# Patient Record
Sex: Female | Born: 1990 | Race: White | Hispanic: No | Marital: Single | Smoking: Never smoker
Health system: Southern US, Community
[De-identification: ages and names within clinical notes are randomized; demographics above are authoritative.]

## PROBLEM LIST (undated history)

## (undated) DIAGNOSIS — K589 Irritable bowel syndrome without diarrhea: Secondary | ICD-10-CM

## (undated) DIAGNOSIS — E559 Vitamin D deficiency, unspecified: Secondary | ICD-10-CM

## (undated) DIAGNOSIS — F329 Major depressive disorder, single episode, unspecified: Secondary | ICD-10-CM

## (undated) DIAGNOSIS — R002 Palpitations: Secondary | ICD-10-CM

## (undated) DIAGNOSIS — E739 Lactose intolerance, unspecified: Secondary | ICD-10-CM

## (undated) DIAGNOSIS — G4730 Sleep apnea, unspecified: Secondary | ICD-10-CM

## (undated) DIAGNOSIS — E162 Hypoglycemia, unspecified: Secondary | ICD-10-CM

## (undated) DIAGNOSIS — F32A Depression, unspecified: Secondary | ICD-10-CM

## (undated) DIAGNOSIS — F988 Other specified behavioral and emotional disorders with onset usually occurring in childhood and adolescence: Secondary | ICD-10-CM

## (undated) DIAGNOSIS — R51 Headache: Secondary | ICD-10-CM

## (undated) DIAGNOSIS — E669 Obesity, unspecified: Secondary | ICD-10-CM

## (undated) DIAGNOSIS — R0602 Shortness of breath: Secondary | ICD-10-CM

## (undated) DIAGNOSIS — F419 Anxiety disorder, unspecified: Secondary | ICD-10-CM

## (undated) HISTORY — DX: Major depressive disorder, single episode, unspecified: F32.9

## (undated) HISTORY — DX: Anxiety disorder, unspecified: F41.9

## (undated) HISTORY — DX: Vitamin D deficiency, unspecified: E55.9

## (undated) HISTORY — DX: Palpitations: R00.2

## (undated) HISTORY — DX: Headache: R51

## (undated) HISTORY — PX: ESOPHAGOGASTRODUODENOSCOPY ENDOSCOPY: SHX5814

## (undated) HISTORY — DX: Irritable bowel syndrome, unspecified: K58.9

## (undated) HISTORY — DX: Sleep apnea, unspecified: G47.30

## (undated) HISTORY — PX: CERVICAL BIOPSY  W/ LOOP ELECTRODE EXCISION: SUR135

## (undated) HISTORY — DX: Lactose intolerance, unspecified: E73.9

## (undated) HISTORY — DX: Obesity, unspecified: E66.9

## (undated) HISTORY — DX: Other specified behavioral and emotional disorders with onset usually occurring in childhood and adolescence: F98.8

## (undated) HISTORY — DX: Shortness of breath: R06.02

## (undated) HISTORY — DX: Hypoglycemia, unspecified: E16.2

## (undated) HISTORY — DX: Depression, unspecified: F32.A

---
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
## 2009-01-13 ENCOUNTER — Ambulatory Visit: Payer: Self-pay | Admitting: Family Medicine

## 2009-01-13 DIAGNOSIS — L719 Rosacea, unspecified: Secondary | ICD-10-CM

## 2009-01-14 ENCOUNTER — Encounter: Payer: Self-pay | Admitting: Family Medicine

## 2009-01-20 ENCOUNTER — Telehealth: Payer: Self-pay | Admitting: Family Medicine

## 2009-01-26 ENCOUNTER — Encounter: Payer: Self-pay | Admitting: Family Medicine

## 2009-02-10 ENCOUNTER — Ambulatory Visit: Payer: Self-pay | Admitting: Family Medicine

## 2009-02-10 DIAGNOSIS — R7309 Other abnormal glucose: Secondary | ICD-10-CM | POA: Insufficient documentation

## 2009-04-12 ENCOUNTER — Encounter: Payer: Self-pay | Admitting: Family Medicine

## 2009-08-16 ENCOUNTER — Ambulatory Visit: Payer: Self-pay | Admitting: Family Medicine

## 2009-08-16 DIAGNOSIS — F341 Dysthymic disorder: Secondary | ICD-10-CM | POA: Insufficient documentation

## 2009-08-28 ENCOUNTER — Ambulatory Visit: Payer: Self-pay | Admitting: Licensed Clinical Social Worker

## 2009-09-08 ENCOUNTER — Ambulatory Visit: Payer: Self-pay | Admitting: Licensed Clinical Social Worker

## 2009-10-02 ENCOUNTER — Ambulatory Visit: Payer: Self-pay | Admitting: Licensed Clinical Social Worker

## 2009-10-03 ENCOUNTER — Ambulatory Visit: Payer: Self-pay | Admitting: Family Medicine

## 2009-10-03 ENCOUNTER — Encounter: Payer: Self-pay | Admitting: Family Medicine

## 2009-10-04 ENCOUNTER — Ambulatory Visit: Payer: Self-pay | Admitting: Family Medicine

## 2009-10-16 ENCOUNTER — Ambulatory Visit: Payer: Self-pay | Admitting: Licensed Clinical Social Worker

## 2009-10-22 ENCOUNTER — Ambulatory Visit: Payer: Self-pay | Admitting: Family Medicine

## 2009-11-13 ENCOUNTER — Ambulatory Visit: Payer: Self-pay | Admitting: Licensed Clinical Social Worker

## 2009-11-22 ENCOUNTER — Ambulatory Visit: Payer: Self-pay | Admitting: Family Medicine

## 2010-01-19 ENCOUNTER — Encounter: Payer: Self-pay | Admitting: Family Medicine

## 2010-03-09 ENCOUNTER — Ambulatory Visit: Payer: Self-pay | Admitting: Family Medicine

## 2010-03-21 ENCOUNTER — Ambulatory Visit: Payer: Self-pay | Admitting: Psychology

## 2010-04-03 ENCOUNTER — Ambulatory Visit: Payer: Self-pay | Admitting: Psychology

## 2010-04-16 ENCOUNTER — Ambulatory Visit: Payer: Self-pay | Admitting: Family Medicine

## 2010-07-17 ENCOUNTER — Encounter: Payer: Self-pay | Admitting: Family Medicine

## 2010-07-17 ENCOUNTER — Ambulatory Visit
Admission: RE | Admit: 2010-07-17 | Discharge: 2010-07-17 | Payer: Self-pay | Source: Home / Self Care | Attending: Family Medicine | Admitting: Family Medicine

## 2010-07-24 NOTE — Consult Note (Signed)
Summary: [facility]  [facility]   Imported By: Lanelle Bal 10/17/2009 09:09:33  _____________________________________________________________________  External Attachment:    Type:   Image     Comment:   External Document

## 2010-07-24 NOTE — Assessment & Plan Note (Signed)
Summary: 4-6 WEEK FOLLOW UP/[facility]H   Vital Signs:  Patient profile:   20 year old female Height:      60.25 inches Weight:      233.25 pounds BMI:     45.34 Temp:     98.1 degrees F oral Pulse rate:   84 / minute Pulse rhythm:   regular BP sitting:   104 / 70  (left arm) Cuff size:   large  Vitals Entered By: Lewanda Rife LPN (April 16, 2010 3:40 PM) CC: follow-up visit   History of Present Illness: here for f/u of depression and eating issues   last visit started her on wellbutrin  this seems to have evened her out in emotions (less ups and downs) still tired  more motivation - but not the energy to back it up and get things done  no side effects  work is going well  has been very hot lately     saw nutritionist prior and was working on some food changes  disc easing into the vegetables  has not made another appt with her  she is really interested in hypnotist  she has barrier - that she will not like it - part of it is texture   is still trying hard with fruit  has not been packing her lunch lately- getting lazy    saw Dr Laymond Purser for counseling she likes her   wt is up 4 lb today    Allergies: 1)  ! * Topamax 2)  ! * Relpax  Past History:  Past Medical History: Last updated: 08/16/2009 obesity (4/04) Headaches (5/06) anxiety picky eater - for the most part no fruit or veg hypogycemia (3 h GTT) depression/ anxiety  neuro- Hickling  counselor- susan Bond   Family History: Last updated: 01/13/2009 MGF- Heart disease PGF- Prostate CA MGM-DM  Social History: Last updated: 10/04/2009 works at [employer]  very picky eating habits - dislikes fruit and veg   Review of Systems General:  Denies fatigue, loss of appetite, and malaise. Eyes:  Denies blurring and eye irritation. CV:  Denies chest pain or discomfort and palpitations. Resp:  Denies cough. GI:  Denies abdominal pain and change in bowel habits. Derm:  Denies itching, lesion(s), poor wound  healing, and rash. Neuro:  Denies numbness and tingling. Psych:  Complains of depression; denies easily tearful, irritability, panic attacks, sense of great danger, and suicidal thoughts/plans; mood is overall improved . Endo:  Denies cold intolerance, excessive thirst, excessive urination, and heat intolerance. Heme:  Denies abnormal bruising and bleeding.  Physical Exam  General:  obese and well appearing  Head:  normocephalic, atraumatic, and no abnormalities observed.   Eyes:  vision grossly intact, pupils equal, pupils round, and pupils reactive to light.  no conjunctival pallor, injection or icterus  Neck:  supple with full rom and no masses or thyromegally, no JVD or carotid bruit  Lungs:  Normal respiratory effort, chest expands symmetrically. Lungs are clear to auscultation, no crackles or wheezes. Heart:  Normal rate and regular rhythm. S1 and S2 normal without gallop, murmur, click, rub or other extra sounds. Neurologic:  sensation intact to light touch, gait normal, and DTRs symmetrical and normal.  no tremor  Skin:  Intact without suspicious lesions or rashes pink cheeks from rosacea Cervical Nodes:  No lymphadenopathy noted Psych:  cheerful and talkative today  still voices phobia of vegetables and healthy eating    Impression & Recommendations:  Problem # 1:  DEPRESSION/ANXIETY (ICD-300.4) Assessment  Improved  wellbutrin is starting to help -- will inc dose to 300 investigate options for hypnotism for fear of vegetables  rev rec for healthy diet and exercise  Orders: Prescription Created Electronically [phone])  Complete Medication List: 1)  Metronidazole 0.75 % Lotn (Metronidazole) .... Apply small amount to face once daily after washing and drying it as needed. 2)  Excedrin Pm 500-38 Mg Tabs (Diphenhydramine-apap (sleep)) .... Otc as directed. 3)  Motrin Ib 200 Mg Tabs (Ibuprofen) .... Otc as directed. 4)  Wellbutrin Xl 300 Mg Xr24h-tab (Bupropion hcl) .Marland Kitchen.. 1 by  mouth once daily  Patient Instructions: 1)  increase the wellbutrin xl 150 to 300 once daily  2)  new px will equal 300  3)  update me if any side effects  4)  I will do some research about hypnotism for diet issues  5)  follow up with me in 3 month  Prescriptions: WELLBUTRIN XL 300 MG XR24H-TAB (BUPROPION HCL) 1 by mouth once daily  #30 x 11   Entered and Authorized by:   Judith Part MD   Signed by:   Judith Part MD on 04/16/2010   Method used:   Electronically to        [facility] (retail)       [address], [address]       [place], [place]  [address]       Ph: [phone]       Fax: [phone]   RxID:   [id number]    Orders Added: 1)  Prescription Created Electronically [G8553] 2)  Est. Patient Level III [27253]    Current Allergies (reviewed today): ! * TOPAMAX ! * RELPAX

## 2010-07-24 NOTE — Assessment & Plan Note (Signed)
Summary: FOLLOWUP/RBH   Vital Signs:  Patient profile:   20 year old female Height:      60.25 inches Weight:      229.75 pounds BMI:     44.66 Temp:     98.2 degrees F oral Pulse rate:   76 / minute Pulse rhythm:   regular BP sitting:   100 / 68  (left arm) Cuff size:   large  Vitals Entered By: Lewanda Rife LPN (March 09, 2010 11:35 AM) CC: follow up from last visit   History of Present Illness: here for f/u of depression and also obesity and hypoglycemia   is doing ok overall   last visit depression was better with counseling Judithe Modest)  is done seeing her - she did not like her a lot  has the name of another counselor   wt is up 3 lb bmi 44  went to nutritionist who made some suggestions  no regular soda -- stopped it totally - no mt dew ,, but still drinks sweet tea - only when she eats out 2 times per week  tried to eat 2 servings of fruit a day-- then lost motivation  using 100 cal snack packs  portion control is better  not walking due to motivation problem  has no motivation to do anything  ? if depression  on emotional roller coaster - and gets frustrated over small things    bp good   work is going well -- on vaction   Allergies: 1)  ! * Topamax 2)  ! * Relpax  Past History:  Past Medical History: Last updated: 08/16/2009 obesity (4/04) Headaches (5/06) anxiety picky eater - for the most part no fruit or veg hypogycemia (3 h GTT) depression/ anxiety  neuro- Hickling  counselor- susan Bond   Family History: Last updated: 01/13/2009 MGF- Heart disease PGF- Prostate CA MGM-DM  Social History: Last updated: 10/04/2009 works at [employer]  very picky eating habits - dislikes fruit and veg   Review of Systems General:  Complains of fatigue; denies loss of appetite, malaise, and sleep disorder. Eyes:  Denies blurring and eye irritation. CV:  Denies chest pain or discomfort, lightheadness, and palpitations. Resp:  Denies cough,  shortness of breath, and wheezing. GI:  Denies abdominal pain, change in bowel habits, indigestion, and nausea. GU:  Denies dysuria. MS:  Denies cramps. Derm:  Denies itching, lesion(s), poor wound healing, and rash. Neuro:  Denies numbness and tingling. Psych:  Complains of anxiety and depression; denies panic attacks, sense of great danger, and suicidal thoughts/plans. Endo:  Denies excessive thirst and excessive urination. Heme:  Denies abnormal bruising and bleeding.  Physical Exam  General:  obese and well appearing  Head:  normocephalic, atraumatic, and no abnormalities observed.   Eyes:  vision grossly intact, pupils equal, pupils round, and pupils reactive to light.  no conjunctival pallor, injection or icterus  Mouth:  pharynx pink and moist.   Neck:  supple with full rom and no masses or thyromegally, no JVD or carotid bruit  Lungs:  Normal respiratory effort, chest expands symmetrically. Lungs are clear to auscultation, no crackles or wheezes. Heart:  Normal rate and regular rhythm. S1 and S2 normal without gallop, murmur, click, rub or other extra sounds. Abdomen:  soft and non-tender.   Msk:  No deformity or scoliosis noted of thoracic or lumbar spine.  no acute joint changes  Extremities:  No clubbing, cyanosis, edema, or deformity noted with normal full range of motion  of all joints.   Neurologic:  sensation intact to light touch, gait normal, and DTRs symmetrical and normal.  no tremor  Skin:  Intact without suspicious lesions or rashes Cervical Nodes:  No lymphadenopathy noted Psych:  talkative but seems down at times very candid about her symptoms good eye contact    Impression & Recommendations:  Problem # 1:  DEPRESSION/ANXIETY (ICD-300.4) Assessment Deteriorated this is worse with fatigue and dec motivation also some food fears/ fear of trying new things ref to new counselor spent 25 minutes face to face time with pt , over 50% of which was spent on  counseling and coordination of care   disc stressors/ symptoms/ coping mech/ support sources and opt for tx trial of wellbutrin xl  f/u 6 wk  Orders: Psychology Referral (Psychology)  Problem # 2:  HYPOGLYCEMIA (ICD-251.2) Assessment: Improved diet is better but need to break through food intol barriers will work with counselor  Problem # 3:  OBESITY (ICD-278.00) Assessment: Unchanged rev rec from nutritionist is apprehensive to try fruit and veg  counseling initiated  needs motivation to exercise  Complete Medication List: 1)  Metronidazole 0.75 % Lotn (Metronidazole) .... Apply small amount to face once daily after washing and drying it as needed. 2)  Excedrin Pm 500-38 Mg Tabs (Diphenhydramine-apap (sleep)) .... Otc as directed. 3)  Motrin Ib 200 Mg Tabs (Ibuprofen) .... Otc as directed. 4)  Wellbutrin Xl 150 Mg Xr24h-tab (Bupropion hcl) .Marland Kitchen.. 1 by mouth once daily in am  Patient Instructions: 1)  try the wellbutrin for depression  2)  if side effects or if you feel more depressed or suicidal -- please stop it and let me know  3)  we will refer you to counselor at check out  4)  try to keep working on diet and exercise  5)  follow up with me in 4-6 weeks  Prescriptions: WELLBUTRIN XL 150 MG XR24H-TAB (BUPROPION HCL) 1 by mouth once daily in am  #30 x 11   Entered and Authorized by:   Judith Part MD   Signed by:   Judith Part MD on 03/09/2010   Method used:   Electronically to        [facility] (retail)       [address], [address]       [place], [place]  [address]       Ph: [phone]       Fax: [phone]   RxID:   [id number]   Current Allergies (reviewed today): ! * TOPAMAX ! * RELPAX

## 2010-07-24 NOTE — Letter (Signed)
Summary: [facility]  [facility]   Imported By: Lanelle Bal 02/05/2010 09:45:11  _____________________________________________________________________  External Attachment:    Type:   Image     Comment:   External Document

## 2010-07-24 NOTE — Assessment & Plan Note (Signed)
Summary: DISCUSS ENERGY LEVEL,LOW SUGAR/CLE   Vital Signs:  Patient profile:   20 year old female Height:      60.25 inches Weight:      221 pounds BMI:     42.96 Temp:     98.2 degrees F oral Pulse rate:   76 / minute Pulse rhythm:   regular BP sitting:   114 / 70  (left arm) Cuff size:   large  Vitals Entered By: Lewanda Rife LPN (August 16, 2009 3:01 PM)  History of Present Illness: is having trouble with low energy level  is really tired - esp at the end of the day  wants to go home and go to bed  works 7-5 -- does not think she is that tired , works at [employer]- likes it ok , happy to have a job  is dismotivated - often does not want to go anywhere  no regular exercise went to gym until oct 0 then stopped  ? if depression and overload   stress sister got married 1 mo and then split  then she moved back home with 3 dogs  this is hard on her  does better apart - does not live well together   also lives with mom and dad    not making enough $ to move out yet  is saving - but had to put new trasmission in her car   also some problems controling emotions  gets the urge to cry and is tearful for more reason hard to focus with some obcessive thoughts -- for example about dating someone  some obtrusive neg thoughts -- no suicidal ideation or plans to hurt herself  some anxiety problems as well - this is ever since HS and seems to be coming back  gets anxous- then hot - then worried and occ almost a panic attack     gf passed away in 04-23-23 after demetia- that was also troubling   talked to counselor in HS - did not like her  was on med in hs -  ? what it was for , and it did not help - and kept her up  was ref to nutritionist in fall - could not go with schedule -- and needs to return  is not eating for hypoglycemia like she should  waits to late to eat in am , and some sweets  also gaining wt  3 meals per day  still gets some hypoglycemic epidoses - esp if  nervous -- and that is not as common as it used to be   is trying some fruit but no vegetables     Allergies: 1)  ! * Topamax 2)  ! * Relpax  Past History:  Past Medical History: obesity (4/04) Headaches (5/06) anxiety picky eater - for the most part no fruit or veg hypogycemia (3 h GTT) depression/ anxiety  neuro- Hickling  counselor- susan Bond   Review of Systems General:  Complains of fatigue; denies fever, loss of appetite, and malaise. Eyes:  Denies blurring and eye irritation. CV:  Denies chest pain or discomfort, lightheadness, palpitations, and shortness of breath with exertion. Resp:  Denies cough and wheezing. GI:  Denies abdominal pain, bloody stools, and change in bowel habits. GU:  Denies dysuria. MS:  Denies joint pain, joint redness, and joint swelling. Derm:  Denies lesion(s), poor wound healing, and rash. Neuro:  Denies numbness and tingling. Psych:  Complains of anxiety and depression; denies panic attacks, sense of  great danger, and suicidal thoughts/plans. Endo:  Denies excessive thirst and excessive urination. Heme:  Denies abnormal bruising and bleeding.  Physical Exam  General:  overweight but generally well appearing  Head:  normocephalic, atraumatic, and no abnormalities observed.   Eyes:  vision grossly intact, pupils equal, pupils round, and pupils reactive to light.  no conjunctival pallor, injection or icterus  Mouth:  pharynx pink and moist.   Neck:  supple with full rom and no masses or thyromegally, no JVD or carotid bruit  Chest Wall:  No deformities, masses, or tenderness noted. Lungs:  Normal respiratory effort, chest expands symmetrically. Lungs are clear to auscultation, no crackles or wheezes. Heart:  Normal rate and regular rhythm. S1 and S2 normal without gallop, murmur, click, rub or other extra sounds. Abdomen:  soft and non-tender.   Msk:  No deformity or scoliosis noted of thoracic or lumbar spine.  no acute joint changes    Extremities:  No clubbing, cyanosis, edema, or deformity noted with normal full range of motion of all joints.   Neurologic:  sensation intact to light touch, gait normal, and DTRs symmetrical and normal.   Skin:  Intact without suspicious lesions or rashes fair complexion Cervical Nodes:  No lymphadenopathy noted Psych:  somewhat depressed but not tearful- more fatigued appearing good eye contact and communication skills    Impression & Recommendations:  Problem # 1:  DEPRESSION/ANXIETY (ICD-300.4) Assessment New with fatigue/ dismotivation and tearfulness/ also anxious tendancies ref to counseling may need to consider ssri in future - depending disc imp of exercise overall good insight disc symptoms/ tx options/ coping skills/ support sources in detail today 30 min face to face time 50% spent on counseling and coordination of care  Orders: Psychology Referral (Psychology)  Problem # 2:  HYPOGLYCEMIA (ICD-251.2) Assessment: Unchanged  poor diet effort with difficult preferences will try again to ref to nutritionist  Orders: Nutrition Referral (Nutrition)  Complete Medication List: 1)  Metronidazole 0.75 % Lotn (Metronidazole) .... Apply small amount to face once daily after washing and drying it 2)  Excedrin Pm 500-38 Mg Tabs (Diphenhydramine-apap (sleep)) .... Otc as directed. 3)  Motrin Ib 200 Mg Tabs (Ibuprofen) .... Otc as directed.  Patient Instructions: 1)  we will do counselor and nutritionist referral at check out  2)  try to work on exercise - push yourself after work if possible  3)  update me if mood worsens  4)  follow up with me in about 2 months   Current Allergies (reviewed today): ! * TOPAMAX ! * RELPAX

## 2010-07-24 NOTE — Assessment & Plan Note (Signed)
Summary: ROA FOR 2 MONTH FOLLOW-UP/JRR   Vital Signs:  Patient profile:   20 year old female Height:      60.25 inches Weight:      226.75 pounds BMI:     44.08 Temp:     97.9 degrees F oral Pulse rate:   64 / minute Pulse rhythm:   regular BP sitting:   104 / 68  (left arm) Cuff size:   large  Vitals Entered By: Lewanda Rife LPN (October 04, 2009 3:34 PM) CC: two month follow up   History of Present Illness: here for f/u of depression and anx symptoms   was ref to counseling- Judithe Modest she went to see her - and has been there 3 times and goes back in 2 weeks  helping her let things go and not try to control what she cannot   mood is much more stable  does not think she needs medication  nutritionist - went yesterday will start planning meals  will also eat breakfast  started today with cutting mt dew in 1/2  still does not eat veg  disc 2-3 servings carbs per meal -- adding brown rice and fruit and some nuts  making sure getting protien with every meal will add more fruit   has gone to exercise once or twice -- work has been too long  now is getting better   Allergies: 1)  ! * Topamax 2)  ! * Relpax  Past History:  Past Medical History: Last updated: 08/16/2009 obesity (4/04) Headaches (5/06) anxiety picky eater - for the most part no fruit or veg hypogycemia (3 h GTT) depression/ anxiety  neuro- Hickling  counselor- susan Bond   Family History: Last updated: 01/13/2009 MGF- Heart disease PGF- Prostate CA MGM-DM  Social History: Last updated: 10/04/2009 works at [employer]  very picky eating habits - dislikes fruit and veg   Social History: works at [employer]  very picky eating habits - dislikes fruit and veg   Review of Systems General:  Denies fatigue, loss of appetite, and malaise. Eyes:  Denies blurring and eye pain. CV:  Denies chest pain or discomfort and palpitations. Resp:  Denies cough and shortness of breath. GI:  Denies abdominal pain  and diarrhea. MS:  Denies joint pain. Derm:  Denies poor wound healing and rash. Neuro:  Denies headaches, numbness, and tingling. Psych:  Complains of anxiety and depression; denies easily tearful, irritability, panic attacks, and sense of great danger. Endo:  Denies cold intolerance, excessive thirst, excessive urination, and heat intolerance. Heme:  Denies abnormal bruising and bleeding.  Physical Exam  General:  overweight but generally well appearing  Head:  normocephalic, atraumatic, and no abnormalities observed.   Eyes:  vision grossly intact, pupils equal, pupils round, and pupils reactive to light.  no conjunctival pallor, injection or icterus  Mouth:  pharynx pink and moist.   Neck:  supple with full rom and no masses or thyromegally, no JVD or carotid bruit  Lungs:  Normal respiratory effort, chest expands symmetrically. Lungs are clear to auscultation, no crackles or wheezes. Heart:  Normal rate and regular rhythm. S1 and S2 normal without gallop, murmur, click, rub or other extra sounds. Extremities:  No clubbing, cyanosis, edema, or deformity noted with normal full range of motion of all joints.   Neurologic:  sensation intact to light touch, gait normal, and DTRs symmetrical and normal.  no tremor  Skin:  Intact without suspicious lesions or rashes Cervical Nodes:  No  lymphadenopathy noted Psych:  more cheerful/ talkative affect seems bright    Impression & Recommendations:  Problem # 1:  DEPRESSION/ANXIETY (ICD-300.4) overall much imp so far with counseling/ better diet and more active lifestyle  Problem # 2:  HYPOGLYCEMIA (ICD-251.2) is starting new diet plan with protien andmore complex carbs and fruit  will continue meeting with nutritionist and working on wt loss also   Complete Medication List: 1)  Metronidazole 0.75 % Lotn (Metronidazole) .... Apply small amount to face once daily after washing and drying it 2)  Excedrin Pm 500-38 Mg Tabs  (Diphenhydramine-apap (sleep)) .... Otc as directed. 3)  Motrin Ib 200 Mg Tabs (Ibuprofen) .... Otc as directed.  Patient Instructions: 1)  keep working on healthy diet and start exercise 5 days per week  2)  continue counseling  3)  let me know if your depressive symptoms worsen again   Current Allergies (reviewed today): ! * TOPAMAX ! * RELPAX

## 2010-08-01 NOTE — Assessment & Plan Note (Signed)
Summary: three month follow up   Vital Signs:  Patient profile:   20 year old female Height:      60.25 inches Weight:      238.25 pounds BMI:     46.31 Temp:     98.8 degrees F oral Pulse rate:   84 / minute Pulse rhythm:   regular BP sitting:   100 / 68  (left arm) Cuff size:   large  Vitals Entered By: Lewanda Rife LPN (July 17, 2010 4:25 PM) CC: three month f/u   History of Present Illness: is feeling pretty good - going up on the wellbutrin to 300 has helped  not as anxious about things -- things do not get to her as much   no loss in appetite or change in energy   is evened out   wanted to join the gym- no time to go  works 7- 4:30 or 5 -- 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 * Topamax 2)  ! * Relpax  Past History:  Past Medical History: Last updated: 08/16/2009 obesity (4/04) Headaches (5/06) anxiety picky eater - for the most part no fruit or veg hypogycemia (3 h GTT) depression/ anxiety  neuro- Hickling  counselor- susan Bond   Family History: Last updated: 01/13/2009 MGF- Heart disease PGF- Prostate CA MGM-DM  Social History: Last updated: 10/04/2009 works at [employer]  very picky eating habits - dislikes fruit and veg   Review of Systems General:  Complains of fatigue; denies malaise and sleep disorder. Eyes:  Denies blurring. CV:  Denies chest pain or discomfort, lightheadness, near fainting, and shortness of breath with exertion. Resp:  Denies cough and shortness of breath. GI:  Denies abdominal pain, change in bowel habits, and indigestion. MS:  Denies joint pain, joint redness, and joint swelling. Derm:  Denies lesion(s), poor wound healing, and rash. Neuro:  Complains of headaches;  denies numbness and tingling. Psych:  Complains of depression; denies irritability, panic attacks, sense of great danger, and suicidal thoughts/plans; overall is improved . Endo:  Denies cold intolerance, excessive thirst, excessive urination, and heat intolerance. Heme:  Denies abnormal bruising and bleeding.  Physical Exam  General:  obese and well appearing  Head:  normocephalic, atraumatic, and no abnormalities observed.   Eyes:  vision grossly intact, pupils equal, pupils round, and pupils reactive to light.  no conjunctival pallor, injection or icterus  Mouth:  pharynx pink and moist.   Neck:  supple with full rom and no masses or thyromegally, no JVD or carotid bruit  Lungs:  Normal respiratory effort, chest expands symmetrically. Lungs are clear to auscultation, no crackles or wheezes. Heart:  Normal rate and regular rhythm. S1 and S2 normal without gallop, murmur, click, rub or other extra sounds. Msk:  No deformity or scoliosis noted of thoracic or lumbar spine.  no acute joint changes  Extremities:  No clubbing, cyanosis, edema, or deformity noted with normal full range of motion of all joints.   Neurologic:  sensation intact to light touch, gait normal, and DTRs symmetrical and normal.  no tremor  Skin:  Intact without suspicious lesions or rashes pink cheeks from rosacea Cervical Nodes:  No lymphadenopathy noted Psych:  cheerful and talkative today  still voices phobia of vegetables and healthy eating- but is making more of an effort than in the past     Impression & Recommendations:  Problem # 1:  DEPRESSION/ANXIETY (ICD-300.4) Assessment Improved is improved with wellbutrin 300  disc inc energy with exercise - strategies for that  disc not skippig meals and slowly working on better diet  urged to consider counseling again in the future   Complete Medication List: 1)  Excedrin Pm 500-38 Mg Tabs (Diphenhydramine-apap (sleep)) .... Otc as directed. 2)  Motrin Ib 200 Mg  Tabs (Ibuprofen) .... Otc as directed. 3)  Wellbutrin Xl 300 Mg Xr24h-tab (Bupropion hcl) .Marland Kitchen.. 1 by mouth once daily 4)  Finacea 15 % Gel (Azelaic acid) .... Apply topically to red areas on face twice a day.  Patient Instructions: 1)  stay on this dose of wellbutrin  2)  avoid caffiene 3)  keep drinking water  4)  try to walk at lunch or after work when you can 5)  keep whole wheat crackers and pb or cheese in purse -- do not skip meals or eating  6)  keep working on slowly trying vegetables    Orders Added: 1)  Est. Patient Level III [29528]    Current Allergies (reviewed today): ! * TOPAMAX ! * RELPAX

## 2010-09-05 ENCOUNTER — Telehealth: Payer: Self-pay | Admitting: Family Medicine

## 2010-09-11 NOTE — Progress Notes (Signed)
Summary: needs written script for mail order   Phone Note Refill Request Message from:  Fax from Pharmacy on September 05, 2010 9:51 AM  Refills Requested: Medication #1:  WELLBUTRIN XL 300 MG XR24H-TAB 1 by mouth once daily Patient is asking for a written script for mail order.    Method Requested: Pick up at Office Initial call taken by: Melody Comas,  September 05, 2010 9:51 AM  Follow-up for Phone Call        printed in put in nurse in box for pickup or fax Follow-up by: Judith Part MD,  September 05, 2010 10:56 AM  Additional Follow-up for Phone Call Additional follow up Details #1::        Left message for patient to call back. Prescription left at front desk. Lewanda Rife LPN  September 05, 2010 11:10 AM   Advised pt script is ready for pick up. Additional Follow-up by: Lowella Petties CMA, AAMA,  September 05, 2010 11:17 AM    Prescriptions: WELLBUTRIN XL 300 MG XR24H-TAB (BUPROPION HCL) 1 by mouth once daily  #90 x 3   Entered and Authorized by:   Judith Part MD   Signed by:   Judith Part MD on 09/05/2010   Method used:   Print then Give to Patient   RxID:   [id number]

## 2011-04-18 ENCOUNTER — Other Ambulatory Visit: Payer: Self-pay | Admitting: Obstetrics and Gynecology

## 2011-04-19 ENCOUNTER — Ambulatory Visit
Admission: RE | Admit: 2011-04-19 | Discharge: 2011-04-19 | Disposition: A | Discharge status: Home / Self Care | Payer: 59 | Source: Ambulatory Visit | Attending: Obstetrics and Gynecology | Admitting: Obstetrics and Gynecology

## 2011-07-21 ENCOUNTER — Telehealth: Payer: Self-pay | Admitting: Family Medicine

## 2011-07-21 DIAGNOSIS — Z0000 Encounter for general adult medical examination without abnormal findings: Secondary | ICD-10-CM | POA: Insufficient documentation

## 2011-07-21 NOTE — Telephone Encounter (Signed)
Message copied by Judy Pimple on Sun Jul 21, 2011 12:52 PM ------      Message from: Alvina Chou      Created: Tue Jul 16, 2011  3:02 PM      Regarding: labs for 07-22-11       Patient is scheduled for CPX labs, please order future labs, Thanks , Camelia Eng

## 2011-07-22 ENCOUNTER — Other Ambulatory Visit (INDEPENDENT_AMBULATORY_CARE_PROVIDER_SITE_OTHER): Payer: 59

## 2011-07-22 DIAGNOSIS — Z0000 Encounter for general adult medical examination without abnormal findings: Secondary | ICD-10-CM

## 2011-07-22 NOTE — Progress Notes (Signed)
Addended by: Baldomero Lamy on: 07/22/2011 08:24 AM   Modules accepted: Orders

## 2011-07-24 LAB — CBC WITH DIFFERENTIAL/PLATELET
Basophils Absolute: 0 10*3/uL (ref 0.0–0.2)
Basos: 1 % (ref 0–3)
Eos: 3 % (ref 0–7)
Eosinophils Absolute: 0.2 10*3/uL (ref 0.0–0.4)
HCT: 42.4 % (ref 34.0–46.6)
Hemoglobin: 14.1 g/dL (ref 11.1–15.9)
Immature Grans (Abs): 0 10*3/uL (ref 0.0–0.1)
Immature Granulocytes: 0 % (ref 0–2)
Lymphocytes Absolute: 1.9 10*3/uL (ref 0.7–4.5)
Lymphs: 36 % (ref 14–46)
MCH: 30.1 pg (ref 26.6–33.0)
MCHC: 33.3 g/dL (ref 31.5–35.7)
MCV: 90 fL (ref 79–97)
Monocytes Absolute: 0.5 10*3/uL (ref 0.1–1.0)
Monocytes: 9 % (ref 4–13)
Neutrophils Absolute: 2.7 10*3/uL (ref 1.8–7.8)
Neutrophils Relative %: 51 % (ref 40–74)
RBC: 4.69 x10E6/uL (ref 3.77–5.28)
RDW: 13.4 % (ref 12.3–15.4)
WBC: 5.4 10*3/uL (ref 4.0–10.5)

## 2011-07-24 LAB — COMPREHENSIVE METABOLIC PANEL
ALT: 15 IU/L (ref 0–32)
AST: 16 IU/L (ref 0–40)
Albumin/Globulin Ratio: 1.5 (ref 1.1–2.5)
Albumin: 3.7 g/dL (ref 3.5–5.5)
Alkaline Phosphatase: 70 IU/L (ref 25–150)
BUN/Creatinine Ratio: 23 — ABNORMAL HIGH (ref 8–20)
BUN: 15 mg/dL (ref 6–20)
CO2: 23 mmol/L (ref 20–32)
Calcium: 8.7 mg/dL (ref 8.7–10.2)
Chloride: 102 mmol/L (ref 97–108)
Creatinine, Ser: 0.64 mg/dL (ref 0.57–1.00)
GFR calc Af Amer: 146 mL/min/{1.73_m2} (ref >59)
GFR calc non Af Amer: 127 mL/min/{1.73_m2} (ref >59)
Globulin, Total: 2.5 g/dL (ref 1.5–4.5)
Glucose: 88 mg/dL (ref 65–99)
Potassium: 3.9 mmol/L (ref 3.5–5.2)
Sodium: 139 mmol/L (ref 134–144)
Total Bilirubin: 0.7 mg/dL (ref 0.0–1.2)
Total Protein: 6.2 g/dL (ref 6.0–8.5)

## 2011-07-24 LAB — TSH: TSH: 2.56 u[IU]/mL (ref 0.450–4.500)

## 2011-07-24 LAB — LIPID PANEL WITH LDL/HDL RATIO
Cholesterol, Total: 171 mg/dL (ref 100–189)
HDL: 51 mg/dL (ref >39)
LDL Calculated: 100 mg/dL (ref 0–119)
LDl/HDL Ratio: 2 ratio units (ref 0.0–3.2)
Triglycerides: 100 mg/dL (ref 0–114)
VLDL Cholesterol Cal: 20 mg/dL (ref 5–40)

## 2011-07-26 ENCOUNTER — Encounter: Payer: Self-pay | Admitting: Family Medicine

## 2011-07-26 ENCOUNTER — Ambulatory Visit (INDEPENDENT_AMBULATORY_CARE_PROVIDER_SITE_OTHER): Payer: 59 | Admitting: Family Medicine

## 2011-07-26 DIAGNOSIS — Z0000 Encounter for general adult medical examination without abnormal findings: Secondary | ICD-10-CM

## 2011-07-26 DIAGNOSIS — E669 Obesity, unspecified: Secondary | ICD-10-CM

## 2011-07-26 DIAGNOSIS — F341 Dysthymic disorder: Secondary | ICD-10-CM

## 2011-07-26 DIAGNOSIS — E162 Hypoglycemia, unspecified: Secondary | ICD-10-CM

## 2011-07-26 MED ORDER — ESCITALOPRAM OXALATE 10 MG PO TABS: 10.0000 mg | ORAL_TABLET | Freq: Every day | ORAL | Status: DC

## 2011-07-26 NOTE — Progress Notes (Signed)
Subjective:    Patient ID: Karla Grant, female    DOB: 1991/05/23, 23 y.o.   MRN: [id number]  HPI Here for health maintenance exam and to review chronic medical problems Has been doing well overall   Was on OC briefly - and not on it any more  Decided she did not want to take it -- made period worse rather than later  Is not sexually active at this time  Had uri over holidays -- fever 102 -- 3 days  Did not get flu shot    Last pap nl July- Dr Henderson Cloud  Had vulvar abcess earlier in the year   Stopped the wellbutrin for depression- did not see enough of a difference  Realizes she is more anxious than depressed -- and notices at night/ when alone/ when driving  Never has been on ssri or anything else Is happier lately - and that is good  Life is going well  Not much stress right now   Really wants to get serious about loosing wt  Now finally eating fruit (not veg yet)  Has done wt watchers in the past  Will try my fitness pal Likes water aerobics - hard to get to it  Wants to join a gym  Will start mvi daily -- since diet is not balanced (or prenatal)  Likes zumba       Wt is down 4 lb with bmi of 43 Is working on it - even over [employer] ok -- overall    Chemistry      Component Value Date/Time   NA 139 07/22/2011 0824   K 3.9 07/22/2011 0824   CL 102 07/22/2011 0824   CO2 23 07/22/2011 0824   BUN 15 07/22/2011 0824   CREATININE 0.64 07/22/2011 0824      Component Value Date/Time   CALCIUM 8.7 07/22/2011 0824   ALKPHOS 70 07/22/2011 0824   AST 16 07/22/2011 0824   ALT 15 07/22/2011 0824   BILITOT 0.7 07/22/2011 0824      Lab Results  Component Value Date   WBC 5.4 07/22/2011   HGB 14.1 07/22/2011   HCT 42.4 07/22/2011   MCV 90 07/22/2011   Lab Results  Component Value Date   TSH 2.560 07/22/2011    Chol prof overall good No results found for this basename: CHOL   Lab Results  Component Value Date   HDL 51 07/22/2011   Lab Results  Component  Value Date   LDLCALC 100 07/22/2011   Lab Results  Component Value Date   TRIG 100 07/22/2011   No results found for this basename: CHOLHDL   No results found for this basename: LDLDIRECT    Diet- is better and better   Flu shot - will look into= can get free at work  Review of Systems  Review of Systems  Constitutional: Negative for fever, appetite change, fatigue and unexpected weight change.  Eyes: Negative for pain and visual disturbance.  Respiratory: Negative for cough and shortness of breath.   Cardiovascular: Negative for cp or palpitations    Gastrointestinal: Negative for nausea, diarrhea and constipation.  Genitourinary: Negative for urgency and frequency.  Skin: Negative for pallor or rash   Neurological: Negative for weakness, light-headedness, numbness and headaches.  Hematological: Negative for adenopathy. Does not bruise/bleed easily.  Psychiatric/Behavioral: pos for mild depression that is improved and significant anxiety/ no SI          Objective:  Physical Exam  Constitutional: She appears well-developed and well-nourished. No distress.       Obese in no distress  HENT:  Head: Normocephalic and atraumatic.  Right Ear: External ear normal.  Left Ear: External ear normal.  Nose: Nose normal.  Mouth/Throat: Oropharynx is clear and moist. No oropharyngeal exudate.  Eyes: Conjunctivae and EOM are normal. Pupils are equal, round, and reactive to light. No scleral icterus.  Neck: Normal range of motion. Neck supple. No tracheal deviation present. No thyromegaly present.  Cardiovascular: Normal rate, regular rhythm, normal heart sounds and intact distal pulses.  Exam reveals no gallop.   Pulmonary/Chest: Effort normal and breath sounds normal. No respiratory distress. She has no wheezes. She exhibits no tenderness.  Abdominal: Soft. Bowel sounds are normal. She exhibits no distension and no mass. There is no tenderness.  Musculoskeletal: Normal range of  motion. She exhibits no edema and no tenderness.  Lymphadenopathy:    She has no cervical adenopathy.  Neurological: She is alert. She has normal reflexes. No cranial nerve deficit. She exhibits normal muscle tone. Coordination normal.  Skin: Skin is warm and dry. No rash noted. No erythema. No pallor.  Psychiatric: Her speech is normal. Judgment and thought content normal. Her mood appears anxious. Her affect is not blunt, not labile and not inappropriate. She is not agitated, not slowed and not withdrawn. Cognition and memory are normal. She expresses no suicidal plans and no homicidal plans.       Talkative/ somewhat anxious but pleasant and smiles most of the time          Assessment & Plan:

## 2011-07-26 NOTE — Assessment & Plan Note (Signed)
Overall did not see much improvement with wellbutrin- so stopped it  With inc problems anxiety wise will try lexapro Disc how it worked and potential side eff Has seen a counselor - overall better coping skills  F/u in about 8 wk but call if severe side eff or worse

## 2011-07-26 NOTE — Assessment & Plan Note (Signed)
Reviewed health habits including diet and exercise and skin cancer prevention Also reviewed health mt list, fam hx and immunizations   Rev wellness labs- good in general  Rev a chol panel at goal as well and disc sat fats in diet Pt will start mvi daily

## 2011-07-26 NOTE — Assessment & Plan Note (Signed)
Discussed how this problem influences overall health and the risks it imposes  Reviewed plan for weight loss with lower calorie diet (via better food choices and also portion control or program like weight watchers) and exercise building up to or more than 30 minutes 5 days per week including some aerobic activity   Thrilled to hear she is using a calorie and fitness app and looking for a gym Already lost 4 lb This will greatly reduce her risk of DM

## 2011-07-26 NOTE — Assessment & Plan Note (Signed)
This has been less of a problem with better diet and eating protien  Pt is going to start tracking her calories with a program now  Ramping up with exercise too  Glucose nl in labs

## 2011-07-26 NOTE — Patient Instructions (Signed)
Start lexapro for anxiety  Common to have some nausea and mental fuzziness for first few days  If you get worse symptom - anxiety / depression-- call and stop medicine  Really work on weight loss  Try out my fitness pal [employer] for exercise 5 days per week -- mix it up  Follow up with me in about 8 weeks  Drink water  Get a flu shot - for free or at pharmacy

## 2011-09-12 ENCOUNTER — Other Ambulatory Visit: Payer: Self-pay | Admitting: Obstetrics and Gynecology

## 2011-09-12 DIAGNOSIS — N63 Unspecified lump in unspecified breast: Secondary | ICD-10-CM

## 2011-09-25 ENCOUNTER — Encounter: Payer: Self-pay | Admitting: Family Medicine

## 2011-09-25 ENCOUNTER — Ambulatory Visit (INDEPENDENT_AMBULATORY_CARE_PROVIDER_SITE_OTHER): Payer: 59 | Admitting: Family Medicine

## 2011-09-25 VITALS — BP 96/70 | HR 74 | Temp 98.7°F | Ht 61.25 in | Wt 238.2 lb

## 2011-09-25 DIAGNOSIS — F341 Dysthymic disorder: Secondary | ICD-10-CM

## 2011-09-25 MED ORDER — ESCITALOPRAM OXALATE 20 MG PO TABS: 20.0000 mg | ORAL_TABLET | Freq: Every day | ORAL | Status: DC

## 2011-09-25 NOTE — Assessment & Plan Note (Signed)
Overall improving anx with lexapro 10 Will adv to 20 with attention to side eff  Disc plan for exercise Is also eating more things - and working on healthy diet
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
## 2011-09-25 NOTE — Patient Instructions (Signed)
Increase your lexapro to 20 mg once daily (2 of the 10 mg you have left)- the new one is 20 mg pill  If any side effects or problems let me know Make a special effort to start exercise  Follow up in 6-8 weeks

## 2011-10-02 ENCOUNTER — Ambulatory Visit
Admission: RE | Admit: 2011-10-02 | Discharge: 2011-10-02 | Disposition: A | Discharge status: Home / Self Care | Payer: 59 | Source: Ambulatory Visit | Attending: Obstetrics and Gynecology | Admitting: Obstetrics and Gynecology

## 2011-10-02 DIAGNOSIS — N63 Unspecified lump in unspecified breast: Secondary | ICD-10-CM

## 2011-11-08 ENCOUNTER — Ambulatory Visit (INDEPENDENT_AMBULATORY_CARE_PROVIDER_SITE_OTHER): Payer: 59 | Admitting: Family Medicine

## 2011-11-08 ENCOUNTER — Encounter: Payer: Self-pay | Admitting: Family Medicine

## 2011-11-08 DIAGNOSIS — G43909 Migraine, unspecified, not intractable, without status migrainosus: Secondary | ICD-10-CM

## 2011-11-08 MED ORDER — GABAPENTIN 300 MG PO CAPS: 300.0000 mg | ORAL_CAPSULE | Freq: Three times a day (TID) | ORAL | Status: DC

## 2011-11-08 NOTE — Patient Instructions (Signed)
For migraine prevention- drink lots of fluids/ eat healthy/ get exercise/ regular sleep / avoid caffeine  Start gabapentin 300 mg once at bedtime After 1 week increase to 300 mg am and bedtime After 2 weeks - if no side effects can increase to three times per day   Expect dizziness/ sleepiness at first that get better quickly- if other problems call or other symptoms

## 2011-11-08 NOTE — Progress Notes (Signed)
Subjective:    Patient ID: Karla Grant, female    DOB: 07/06/1990, 21 y.o.   MRN: [id number]  HPI Is here with more headaches  More frequent and worse  Some nausea  Has had hx of headache  Having them daily  Base of neck to eye brows Both sides - starts on one side  Throbbing  Worse with exertion   A few bouts of dizziness as aura   Was on topamax in the past - did not tolerate   Went to the headache clinic in the past  ? Remember what worked   Karla Grant had to stop the lexapro - because of sleep issues   Karla Grant follows all the migraine rules - for lifestyle  Is hungry easily  Eating frequently-not getting low sugar   Anx/dep doing ok   Excedrin/ aleve/ sinus med-no help   Patient Active Problem List  Diagnoses  . HYPOGLYCEMIA  . OBESITY  . DEPRESSION/ANXIETY  . ACNE ROSACEA  . Routine general medical examination at a health care facility  . Migraine   Past Medical History  Diagnosis Date  . Obesity   . Headache   . Anxiety   . Depression   . Hypoglycemia    No past surgical history on file. History  Substance Use Topics  . Smoking status: Never Smoker   . Smokeless tobacco: Not on file  . Alcohol Use: Not on file   Family History  Problem Relation Age of Onset  . Diabetes Maternal Grandmother   . Heart disease Maternal Grandfather   . Cancer Paternal Grandfather     cancer   Allergies  Allergen Reactions  . Eletriptan Hydrobromide     REACTION: chest pain  . Topiramate     REACTION: no help   Current Outpatient Prescriptions on File Prior to Visit  Medication Sig Dispense Refill  . Multiple Vitamin (MULTIVITAMIN) tablet Take 1 tablet by mouth daily.      Marland Kitchen gabapentin (NEURONTIN) 300 MG capsule Take 1 capsule (300 mg total) by mouth 3 (three) times daily.  90 capsule  3      Review of Systems Review of Systems  Constitutional: Negative for fever, appetite change, fatigue and unexpected weight change.  Eyes: Negative for pain and visual  disturbance.  Respiratory: Negative for cough and shortness of breath.   Cardiovascular: Negative for cp or palpitations    Gastrointestinal: Negative for nausea, diarrhea and constipation.  Genitourinary: Negative for urgency and frequency.  Skin: Negative for pallor or rash   Neurological: Negative for weakness, light-headedness, numbness , pos for headaches  Hematological: Negative for adenopathy. Does not bruise/bleed easily.  Psychiatric/Behavioral: Negative for dysphoric mood. The patient is not nervous/anxious.         Objective:   Physical Exam  Constitutional: Karla Grant is oriented to person, place, and time. Karla Grant appears well-developed and well-nourished.  HENT:  Head: Normocephalic and atraumatic.  Right Ear: External ear normal.  Left Ear: External ear normal.  Nose: Nose normal.  Mouth/Throat: Oropharynx is clear and moist. No oropharyngeal exudate.       No sinus tenderness  Eyes: Conjunctivae and EOM are normal. Pupils are equal, round, and reactive to light. Right eye exhibits no discharge. Left eye exhibits no discharge. No scleral icterus.  Neck: Normal range of motion. Neck supple. No JVD present. Carotid bruit is not present. No thyromegaly present.  Cardiovascular: Normal rate, regular rhythm, normal heart sounds and intact distal pulses.  Exam reveals no gallop.  Pulmonary/Chest: Effort normal and breath sounds normal. No respiratory distress. Karla Grant has no wheezes.  Abdominal: Soft. Bowel sounds are normal. Karla Grant exhibits no abdominal bruit.  Musculoskeletal: Normal range of motion. Karla Grant exhibits no edema and no tenderness.  Lymphadenopathy:    Karla Grant has no cervical adenopathy.  Neurological: Karla Grant is alert and oriented to person, place, and time. Karla Grant has normal reflexes. Karla Grant displays no atrophy. No cranial nerve deficit or sensory deficit. Karla Grant exhibits normal muscle tone. Coordination and gait normal.       No focal cerebellar signs   Skin: Skin is warm and dry. No rash noted.  No erythema. No pallor.  Psychiatric: Karla Grant has a normal mood and affect.          Assessment & Plan:

## 2011-11-08 NOTE — Assessment & Plan Note (Signed)
Worse lately/ daily - poss with variety of triggers Disc lifestyle change Reassuring exam Trial of gabapentin 300- to work up to tid as tol and then f/u

## 2011-11-13 ENCOUNTER — Ambulatory Visit: Payer: 59 | Admitting: Family Medicine

## 2011-12-02 ENCOUNTER — Encounter: Payer: Self-pay | Admitting: Family Medicine

## 2011-12-02 ENCOUNTER — Ambulatory Visit (INDEPENDENT_AMBULATORY_CARE_PROVIDER_SITE_OTHER): Payer: 59 | Admitting: Family Medicine

## 2011-12-02 VITALS — BP 112/76 | HR 84 | Temp 98.1°F | Wt 251.0 lb

## 2011-12-02 DIAGNOSIS — M26609 Unspecified temporomandibular joint disorder, unspecified side: Secondary | ICD-10-CM | POA: Insufficient documentation

## 2011-12-02 NOTE — Patient Instructions (Addendum)
I think you have some TMJ (jaw joint pain) Use aleve 1-2 pills twice daily with a meal for 5-7 days Use warm compress on your jaw when able  Avoid chewy foods and gum Drink fluids with straw  Update if not starting to improve in a week or if worsening

## 2011-12-02 NOTE — Assessment & Plan Note (Signed)
Referring pain to the right ear  Pain on opening jaw  Handout given  Will tx with aleve bid for 1 week and warm compreses/soft foods  Update if not starting to improve in a week or if worsening

## 2011-12-02 NOTE — Progress Notes (Signed)
  Subjective:    Patient ID: Karla Grant, female    DOB: 01/05/1991, 21 y.o.   MRN: [id number]  HPI Is having ear pain R ear since this weekend  Hurts to open jaw and chew  And also hurts to sneeze Some post nasal drip but not a lot of congestion No st  No fever  Has had jaw pain in the past  Tries not to chew gum   Took some sinus med - ? If helped    Patient Active Problem List  Diagnoses  . HYPOGLYCEMIA  . OBESITY  . DEPRESSION/ANXIETY  . ACNE ROSACEA  . Routine general medical examination at a health care facility  . Migraine   Past Medical History  Diagnosis Date  . Obesity   . Headache   . Anxiety   . Depression   . Hypoglycemia    No past surgical history on file. History  Substance Use Topics  . Smoking status: Never Smoker   . Smokeless tobacco: Not on file  . Alcohol Use: Yes     Occasional   Family History  Problem Relation Age of Onset  . Diabetes Maternal Grandmother   . Heart disease Maternal Grandfather   . Cancer Paternal Grandfather     cancer   Allergies  Allergen Reactions  . Eletriptan Hydrobromide     REACTION: chest pain  . Topiramate     REACTION: no help   Current Outpatient Prescriptions on File Prior to Visit  Medication Sig Dispense Refill  . gabapentin (NEURONTIN) 300 MG capsule Take 1 capsule (300 mg total) by mouth 3 (three) times daily.  90 capsule  3  . Multiple Vitamin (MULTIVITAMIN) tablet Take 1 tablet by mouth daily.          Review of Systems Review of Systems  Constitutional: Negative for fever, appetite change, fatigue and unexpected weight change.  Eyes: Negative for pain and visual disturbance.  ENT pos for R ear and jaw pain/ neg for sinus pain or ringing in ears  Respiratory: Negative for cough and shortness of breath.   Cardiovascular: Negative for cp or palpitations    Gastrointestinal: Negative for nausea, diarrhea and constipation.  Genitourinary: Negative for urgency and frequency.  Skin: Negative  for pallor or rash   Neurological: Negative for weakness, light-headedness, numbness and headaches.  Hematological: Negative for adenopathy. Does not bruise/bleed easily.  Psychiatric/Behavioral: Negative for dysphoric mood. The patient is not nervous/anxious.         Objective:   Physical Exam  HENT:  Head: Normocephalic and atraumatic.  Right Ear: External ear normal.  Left Ear: External ear normal.  Nose: Nose normal.  Mouth/Throat: Oropharynx is clear and moist. No oropharyngeal exudate.       Nares are boggy  R TM joint is tender to palp  No clicking or dislocation  Nl dentition Cannot open mouth wide due to pain No swelling or skin change   Neck: Normal range of motion. Neck supple. No thyromegaly present.  Cardiovascular: Normal rate and regular rhythm.   Pulmonary/Chest: Effort normal and breath sounds normal.  Lymphadenopathy:    She has no cervical adenopathy.  Neurological: She is alert. No cranial nerve deficit.  Skin: Skin is warm and dry. No rash noted.  Psychiatric: She has a normal mood and affect.          Assessment & Plan:

## 2011-12-24 ENCOUNTER — Ambulatory Visit: Payer: 59 | Admitting: Family Medicine

## 2011-12-25 ENCOUNTER — Ambulatory Visit (INDEPENDENT_AMBULATORY_CARE_PROVIDER_SITE_OTHER): Payer: 59 | Admitting: Family Medicine

## 2011-12-25 ENCOUNTER — Encounter: Payer: Self-pay | Admitting: Family Medicine

## 2011-12-25 VITALS — BP 116/79 | HR 71 | Temp 97.8°F | Ht 61.0 in | Wt 251.8 lb

## 2011-12-25 DIAGNOSIS — F341 Dysthymic disorder: Secondary | ICD-10-CM

## 2011-12-25 DIAGNOSIS — G43909 Migraine, unspecified, not intractable, without status migrainosus: Secondary | ICD-10-CM

## 2011-12-25 MED ORDER — GABAPENTIN 300 MG PO CAPS: 300.0000 mg | ORAL_CAPSULE | Freq: Three times a day (TID) | ORAL | Status: DC

## 2011-12-25 NOTE — Assessment & Plan Note (Signed)
No longer on lexapro Gabapentin helping mood/ anxiety Wt is stable  Disc imp of exercise  Plans to start working on wt loss

## 2011-12-25 NOTE — Assessment & Plan Note (Signed)
Much improved with gabapentin - which is also helping mood  Will continue tid  Disc imp of exercise and starting a program  Will f/u 6 mo

## 2011-12-25 NOTE — Progress Notes (Signed)
Subjective:    Patient ID: Karla Grant, female    DOB: [birth year], 21 y.o.   MRN: [id number]  HPI Here for f/u of migraine and depression/ anx  Overall is doing well but energy level is still low   Wt is stable   On gabapentin for migraine-- wants to get that sent to [facility]  Has helped a lot - really helps her sleep  Is taking 2-3 times per day  Usually 3 times per day   Is not exercising  Bought a pedometer  Cannot find anywhere to do water aerobics  Will look into that   Much better with mood No longer needs lexapro  Patient Active Problem List  Diagnosis  . HYPOGLYCEMIA  . OBESITY  . DEPRESSION/ANXIETY  . ACNE ROSACEA  . Routine general medical examination at a health care facility  . Migraine  . TMJ (temporomandibular joint disorder)   Past Medical History  Diagnosis Date  . Obesity   . Headache   . Anxiety   . Depression   . Hypoglycemia    No past surgical history on file. History  Substance Use Topics  . Smoking status: Never Smoker   . Smokeless tobacco: Not on file  . Alcohol Use: Yes     Occasional   Family History  Problem Relation Age of Onset  . Diabetes Maternal Grandmother   . Heart disease Maternal Grandfather   . Cancer Paternal Grandfather     cancer   Allergies  Allergen Reactions  . Eletriptan Hydrobromide     REACTION: chest pain  . Topiramate     REACTION: no help   Current Outpatient Prescriptions on File Prior to Visit  Medication Sig Dispense Refill  . gabapentin (NEURONTIN) 300 MG capsule Take 1 capsule (300 mg total) by mouth 3 (three) times daily.  90 capsule  3  . Multiple Vitamin (MULTIVITAMIN) tablet Take 1 tablet by mouth daily.          Review of Systems Review of Systems  Constitutional: Negative for fever, appetite change, fatigue and unexpected weight change.  Eyes: Negative for pain and visual disturbance.  Respiratory: Negative for cough and shortness of breath.   Cardiovascular: Negative for cp or  palpitations    Gastrointestinal: Negative for nausea, diarrhea and constipation.  Genitourinary: Negative for urgency and frequency.  Skin: Negative for pallor or rash   Neurological: Negative for weakness, light-headedness, numbness and improvement in headaches  Hematological: Negative for adenopathy. Does not bruise/bleed easily.  Psychiatric/Behavioral: Negative for dysphoric mood. The patient is not nervous/anxious.         Objective:   Physical Exam  Constitutional: She appears well-developed and well-nourished. No distress.       Obese and well appearing   HENT:  Head: Normocephalic and atraumatic.  Mouth/Throat: Oropharynx is clear and moist.  Eyes: Conjunctivae and EOM are normal. Pupils are equal, round, and reactive to light. No scleral icterus.  Neck: Normal range of motion. Neck supple. No JVD present. No thyromegaly present.  Cardiovascular: Normal rate and regular rhythm.   Pulmonary/Chest: Effort normal. She has no wheezes.  Musculoskeletal: She exhibits no edema.  Lymphadenopathy:    She has no cervical adenopathy.  Neurological: She is alert. She has normal strength and normal reflexes. She displays no atrophy and no tremor. No cranial nerve deficit or sensory deficit. She exhibits normal muscle tone. Coordination and gait normal.  Skin: Skin is warm and dry. No rash noted.  Psychiatric: She  has a normal mood and affect.       Cheerful and talkative today          Assessment & Plan:

## 2011-12-25 NOTE — Patient Instructions (Addendum)
I sent gabapentin to [facility] - I am glad it is working for you Now focus on exercise - aim for 5 days per week 30 minutes- walking / water exercise  Also keep working on diet Follow up in 6 months

## 2012-01-22 ENCOUNTER — Encounter: Payer: Self-pay | Admitting: Family Medicine

## 2012-01-22 ENCOUNTER — Ambulatory Visit (INDEPENDENT_AMBULATORY_CARE_PROVIDER_SITE_OTHER): Payer: 59 | Admitting: Family Medicine

## 2012-01-22 VITALS — BP 126/76 | HR 80 | Temp 97.9°F | Wt 255.0 lb

## 2012-01-22 DIAGNOSIS — J069 Acute upper respiratory infection, unspecified: Secondary | ICD-10-CM

## 2012-01-22 DIAGNOSIS — J029 Acute pharyngitis, unspecified: Secondary | ICD-10-CM

## 2012-01-22 LAB — POCT RAPID STREP A (OFFICE): Rapid Strep A Screen: NEGATIVE

## 2012-01-22 MED ORDER — AZITHROMYCIN 250 MG PO TABS: ORAL_TABLET | ORAL | Status: AC

## 2012-01-22 MED ORDER — GUAIFENESIN-CODEINE 100-10 MG/5ML PO SYRP: 5.0000 mL | ORAL_SOLUTION | Freq: Two times a day (BID) | ORAL | Status: AC | PRN

## 2012-01-22 NOTE — Progress Notes (Signed)
  Subjective:    Patient ID: Karla Grant, female    DOB: [birth year], 21 y.o.   MRN: [id number]  HPI CC: ST  1 wk h/o ST, had to leave work early.  Stayed home on Friday.  Then 3d ago started coughing, unable to sleep 2/2 cough.  Also with hoarseness.  + head congestion.  Dry cough, with headache.  + L ear pain.  + HA with cough, described as throbbing pressure frontally.  So far has tried OTC daytime meds as well as nyquil.  Also tried cough drops.  No fevers/chills, tooth pain.  No abd pain, n/v, chest pain/SOB.  Sister recently sick, used same cup to drink.  No smokers at home.  No h/o asthma.  Past Medical History  Diagnosis Date  . Obesity   . Headache   . Anxiety   . Depression   . Hypoglycemia      Review of Systems Per HPI    Objective:   Physical Exam  Nursing note and vitals reviewed. Constitutional: She appears well-developed and well-nourished. No distress.       hoarse  HENT:  Head: Normocephalic and atraumatic.  Right Ear: Hearing, tympanic membrane, external ear and ear canal normal.  Left Ear: Hearing, external ear and ear canal normal.  Nose: No mucosal edema or rhinorrhea. Right sinus exhibits no maxillary sinus tenderness and no frontal sinus tenderness. Left sinus exhibits no maxillary sinus tenderness and no frontal sinus tenderness.  Mouth/Throat: Uvula is midline and mucous membranes are normal. Posterior oropharyngeal erythema (mild) present. No oropharyngeal exudate, posterior oropharyngeal edema or tonsillar abscesses.       L TM erythematous, diminished light reflex, not bulging and able to move freely with insufflation.  Eyes: Conjunctivae and EOM are normal. Pupils are equal, round, and reactive to light. No scleral icterus.  Neck: Normal range of motion. Neck supple.  Cardiovascular: Normal rate, regular rhythm, normal heart sounds and intact distal pulses.   No murmur heard. Pulmonary/Chest: Effort normal and breath sounds normal. No  respiratory distress. She has no wheezes. She has no rales.  Lymphadenopathy:    She has no cervical adenopathy.  Skin: Skin is warm and dry. No rash noted.       Assessment & Plan:

## 2012-01-22 NOTE — Patient Instructions (Signed)
I think you have a viral upper respiratory infection. Viral infections usually take 7-10 days to resolve.  The cough can last some weeks to go away. Use medication as prescribed: cheratussin for cough at night. Push fluids and plenty of rest.  May use robitussin during the day.  May use ibuprofen for headache and throat pain. Please return if you are not improving as expected, or if you have high fevers (>101.5) or difficulty swallowing or worsening productive cough. Call clinic with questions.  Good to see you today.  If not improving as expected, or worsening ear pain, fill antibiotic.

## 2012-01-22 NOTE — Assessment & Plan Note (Signed)
Anticipate upper respiratory site infection, multiple sites (bronchitis, laryngitis, possible early acute otitis media). Discussed likely viral process, however if not improving as expected or any worsening, cover bacterial cause with zpack (WASP provided).  cheratussin for cough at night. Pt agrees with plan.

## 2012-06-26 ENCOUNTER — Encounter: Payer: Self-pay | Admitting: Family Medicine

## 2012-06-26 ENCOUNTER — Ambulatory Visit (INDEPENDENT_AMBULATORY_CARE_PROVIDER_SITE_OTHER): Payer: 59 | Admitting: Family Medicine

## 2012-06-26 VITALS — BP 108/66 | HR 70 | Temp 98.1°F | Ht 61.0 in | Wt 251.8 lb

## 2012-06-26 DIAGNOSIS — G43909 Migraine, unspecified, not intractable, without status migrainosus: Secondary | ICD-10-CM

## 2012-06-26 DIAGNOSIS — F341 Dysthymic disorder: Secondary | ICD-10-CM

## 2012-06-26 DIAGNOSIS — Z23 Encounter for immunization: Secondary | ICD-10-CM

## 2012-06-26 DIAGNOSIS — E669 Obesity, unspecified: Secondary | ICD-10-CM

## 2012-06-26 MED ORDER — GABAPENTIN 300 MG PO CAPS: ORAL_CAPSULE | ORAL | Status: DC

## 2012-06-26 NOTE — Assessment & Plan Note (Signed)
Very well controlled with better health habits and gabapentin  Will inc pm dose for better sleep  Urged to keep up the good work with wt loss

## 2012-06-26 NOTE — Patient Instructions (Addendum)
I'm glad you are doing well and loosing weight  Keep up the great exercise and diet- keep trying new foods Increase your gabapentin to 1 in am 1 midday and 2 at bedtime -hopefully this will help you sleep better  If headaches change, let me know  Schedule physical with labs prior in 6 months

## 2012-06-26 NOTE — Progress Notes (Signed)
Subjective:    Patient ID: Karla Grant, female    DOB: Feb 02, 1991, 22 y.o.   MRN: [id number]  HPI Here for f/u of chronic medical problems   Woke up with a cold this am  Post nasal drip    Wt is down 4 lb with bmi of 47 She is doing better with her diet- trying some new things  She tried a lettuce rap Eating at least a servings of apples per day Is happy to see herself loose weight - she had initially gained wt and has lost more than 10 lb  Has been working out -- did boot camp for 10 weeks until she dropped a tire on her foot- is getting better  Is going to try zumba   Flu vaccine - has not had it  Wants to get that today   Mood- is good overall  Not sleeping as well as she was  Has to get up frequently with her dog    Migraine on gabapentin Has been in very good control Only a few headaches- weather related   Patient Active Problem List  Diagnosis  . HYPOGLYCEMIA  . OBESITY  . DEPRESSION/ANXIETY  . ACNE ROSACEA  . Routine general medical examination at a health care facility  . Migraine  . TMJ (temporomandibular joint disorder)  . Viral URI with cough   Past Medical History  Diagnosis Date  . Obesity   . Headache   . Anxiety   . Depression   . Hypoglycemia    No past surgical history on file. History  Substance Use Topics  . Smoking status: Never Smoker   . Smokeless tobacco: Not on file  . Alcohol Use: Yes     Comment: Occasional   Family History  Problem Relation Age of Onset  . Diabetes Maternal Grandmother   . Heart disease Maternal Grandfather   . Cancer Paternal Grandfather     cancer   Allergies  Allergen Reactions  . Eletriptan Hydrobromide     REACTION: chest pain  . Topiramate     REACTION: no help   Current Outpatient Prescriptions on File Prior to Visit  Medication Sig Dispense Refill  . gabapentin (NEURONTIN) 300 MG capsule Take 1 capsule (300 mg total) by mouth 3 (three) times daily.  270 capsule  3  . Multiple Vitamin  (MULTIVITAMIN) tablet Take 1 tablet by mouth daily.          Review of Systems Review of Systems  Constitutional: Negative for fever, appetite change, fatigue and unexpected weight change.  Eyes: Negative for pain and visual disturbance.  Respiratory: Negative for cough and shortness of breath.   Cardiovascular: Negative for cp or palpitations    Gastrointestinal: Negative for nausea, diarrhea and constipation.  Genitourinary: Negative for urgency and frequency.  Skin: Negative for pallor or rash   Neurological: Negative for weakness, light-headedness, numbness and pos for headaches that have greatly improved  Hematological: Negative for adenopathy. Does not bruise/bleed easily.  Psychiatric/Behavioral: pos for mild depression and anxiety that have greatly improved, neg for SI       Objective:   Physical Exam  Constitutional: She appears well-developed and well-nourished. No distress.       Morbidly obese and well app  HENT:  Head: Normocephalic and atraumatic.  Mouth/Throat: Oropharynx is clear and moist.  Eyes: Conjunctivae normal and EOM are normal. Pupils are equal, round, and reactive to light. Right eye exhibits no discharge. Left eye exhibits no discharge. No scleral  icterus.  Neck: Normal range of motion. Neck supple. No JVD present. Carotid bruit is not present. No thyromegaly present.  Cardiovascular: Normal rate, regular rhythm, normal heart sounds and intact distal pulses.  Exam reveals no gallop.   Pulmonary/Chest: Effort normal and breath sounds normal. No respiratory distress. She has no wheezes.  Abdominal: Soft. Bowel sounds are normal.  Musculoskeletal: She exhibits no edema.  Lymphadenopathy:    She has no cervical adenopathy.  Neurological: She is alert. She has normal reflexes. She displays no atrophy and no tremor. No cranial nerve deficit or sensory deficit. She exhibits normal muscle tone. Coordination normal.       No focal cerebellar signs  Skin: Skin is  dry. No rash noted. No pallor.  Psychiatric: She has a normal mood and affect. Her mood appears not anxious. Her affect is not blunt and not labile. She does not exhibit a depressed mood.          Assessment & Plan:

## 2012-06-26 NOTE — Assessment & Plan Note (Signed)
Doing very well with a positive outlook and the gabapentin helps with anxiety also  Will continue to follow

## 2012-06-26 NOTE — Assessment & Plan Note (Signed)
Pt has lost over 10 lb by her scales with excellent exercise Working hard on diet -inc more fruit and veg- in a very picky eater  Enc her to keep working on it

## 2012-12-10 ENCOUNTER — Telehealth: Payer: Self-pay | Admitting: Family Medicine

## 2012-12-10 DIAGNOSIS — Z0000 Encounter for general adult medical examination without abnormal findings: Secondary | ICD-10-CM

## 2012-12-10 NOTE — Telephone Encounter (Signed)
Message copied by Judy Pimple on Thu Dec 10, 2012  3:50 PM ------      Message from: Baldomero Lamy      Created: Fri Dec 04, 2012 11:34 AM      Regarding: Cpx labs Fri 6/27       Please order  future cpx labs for pt's upcoming lab appt.      Thanks      Tasha       ------

## 2012-12-18 ENCOUNTER — Other Ambulatory Visit (INDEPENDENT_AMBULATORY_CARE_PROVIDER_SITE_OTHER): Payer: 59

## 2012-12-18 DIAGNOSIS — Z0000 Encounter for general adult medical examination without abnormal findings: Secondary | ICD-10-CM

## 2012-12-19 LAB — COMPREHENSIVE METABOLIC PANEL
ALT: 18 IU/L (ref 0–32)
AST: 20 IU/L (ref 0–40)
Albumin/Globulin Ratio: 1.3 (ref 1.1–2.5)
Albumin: 3.6 g/dL (ref 3.5–5.5)
Alkaline Phosphatase: 75 IU/L (ref 39–117)
BUN/Creatinine Ratio: 13 (ref 8–20)
BUN: 8 mg/dL (ref 6–20)
CO2: 24 mmol/L (ref 18–29)
Calcium: 9.1 mg/dL (ref 8.7–10.2)
Chloride: 105 mmol/L (ref 97–108)
Creatinine, Ser: 0.64 mg/dL (ref 0.57–1.00)
GFR calc Af Amer: 144 mL/min/{1.73_m2} (ref >59)
GFR calc non Af Amer: 125 mL/min/{1.73_m2} (ref >59)
Globulin, Total: 2.8 g/dL (ref 1.5–4.5)
Glucose: 90 mg/dL (ref 65–99)
Potassium: 4 mmol/L (ref 3.5–5.2)
Sodium: 142 mmol/L (ref 134–144)
Total Bilirubin: 0.3 mg/dL (ref 0.0–1.2)
Total Protein: 6.4 g/dL (ref 6.0–8.5)

## 2012-12-19 LAB — CBC WITH DIFFERENTIAL/PLATELET
Basophils Absolute: 0 10*3/uL (ref 0.0–0.2)
Basos: 1 % (ref 0–3)
Eos: 3 % (ref 0–5)
Eosinophils Absolute: 0.2 10*3/uL (ref 0.0–0.4)
HCT: 41.8 % (ref 34.0–46.6)
Hemoglobin: 13.8 g/dL (ref 11.1–15.9)
Immature Grans (Abs): 0 10*3/uL (ref 0.0–0.1)
Immature Granulocytes: 0 % (ref 0–2)
Lymphocytes Absolute: 2.2 10*3/uL (ref 0.7–3.1)
Lymphs: 38 % (ref 14–46)
MCH: 28.9 pg (ref 26.6–33.0)
MCHC: 33 g/dL (ref 31.5–35.7)
MCV: 88 fL (ref 79–97)
Monocytes Absolute: 0.5 10*3/uL (ref 0.1–0.9)
Monocytes: 9 % (ref 4–12)
Neutrophils Absolute: 2.9 10*3/uL (ref 1.4–7.0)
Neutrophils Relative %: 49 % (ref 40–74)
RBC: 4.77 x10E6/uL (ref 3.77–5.28)
RDW: 13.3 % (ref 12.3–15.4)
WBC: 5.8 10*3/uL (ref 3.4–10.8)

## 2012-12-19 LAB — LIPID PANEL
Chol/HDL Ratio: 4 ratio units (ref 0.0–4.4)
Cholesterol, Total: 163 mg/dL (ref 100–189)
HDL: 41 mg/dL (ref >39)
LDL Calculated: 87 mg/dL (ref 0–119)
Triglycerides: 173 mg/dL — ABNORMAL HIGH (ref 0–114)
VLDL Cholesterol Cal: 35 mg/dL (ref 5–40)

## 2012-12-19 LAB — TSH: TSH: 3 u[IU]/mL (ref 0.450–4.500)

## 2012-12-28 ENCOUNTER — Encounter: Payer: Self-pay | Admitting: Family Medicine

## 2012-12-28 ENCOUNTER — Ambulatory Visit (INDEPENDENT_AMBULATORY_CARE_PROVIDER_SITE_OTHER): Payer: 59 | Admitting: Family Medicine

## 2012-12-28 VITALS — BP 112/82 | HR 84 | Temp 98.2°F | Ht 61.5 in | Wt 257.8 lb

## 2012-12-28 DIAGNOSIS — G4700 Insomnia, unspecified: Secondary | ICD-10-CM

## 2012-12-28 DIAGNOSIS — Z0000 Encounter for general adult medical examination without abnormal findings: Secondary | ICD-10-CM

## 2012-12-28 DIAGNOSIS — Z23 Encounter for immunization: Secondary | ICD-10-CM

## 2012-12-28 DIAGNOSIS — E669 Obesity, unspecified: Secondary | ICD-10-CM

## 2012-12-28 NOTE — Progress Notes (Signed)
Subjective:    Patient ID: Karla Grant, female    DOB: 03-03-1991, 22 y.o.   MRN: [id number]  HPI Here for health maintenance exam and to review chronic medical problems    Has been doing very well  She stopped taking the gabapentin - it stopped helping her sleep well and she decided to stop it  Headaches are not too bad -mostly allergy related  For sleep problem- motrin pm and it helped  Wants to see how she does without meds right now - and use benadryl prn  Work position - was stressful but getting used to it - pleased overall  Has not exercised for 2 weeks with job change and also had a bad cold  Energy level is low - even with exercise    Wt is up 6 lb with bmi of 47 Habits -needs to get back to exercise Diet is not as restrictive- trying new things  She still eats fast food and also fried foods  Eating some fruit every day -and that is going well  Now eating salads and broccoli -- now getting better with that - a lot of progress  Also some beans and likes fish   Gyn hx  Has had a b9 breast lump in the past - was fine  Sees Dr Henderson Cloud and she had exam in nov -nl pap   Td 11/04 - is due for that  Flu shot 1/14  Gets them yearly   fam hx - none new   Has a few dry skin spots on abdomen A little itchy   Patient Active Problem List   Diagnosis Date Noted  . Insomnia 12/28/2012  . TMJ (temporomandibular joint disorder) 12/02/2011  . Migraine 11/08/2011  . Routine general medical examination at a health care facility 07/21/2011  . OBESITY 03/09/2010  . DEPRESSION/ANXIETY 08/16/2009  . HYPOGLYCEMIA 02/10/2009  . 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 Obesity   . Headache(784.0)   . Anxiety   . Depression   . Hypoglycemia    No past surgical history on file. History  Substance Use Topics  . Smoking status: Never Smoker   . Smokeless tobacco: Not on file  . Alcohol Use: Yes     Comment: Occasional   Family History  Problem  Relation Age of Onset  . Diabetes Maternal Grandmother   . Heart disease Maternal Grandfather   . Cancer Paternal Grandfather     cancer   Allergies  Allergen Reactions  . Eletriptan Hydrobromide     REACTION: chest pain  . Topiramate     REACTION: no help   Current Outpatient Prescriptions on File Prior to Visit  Medication Sig Dispense Refill  . Multiple Vitamin (MULTIVITAMIN) tablet Take 1 tablet by mouth daily.       No current facility-administered medications on file prior to visit.    Review of Systems Review of Systems  Constitutional: Negative for fever, appetite change,  and unexpected weight change. pos for fatigue and general low energy Eyes: Negative for pain and visual disturbance.  Respiratory: Negative for cough and shortness of breath.   Cardiovascular: Negative for cp or palpitations    Gastrointestinal: Negative for nausea, diarrhea and constipation.  Genitourinary: Negative for urgency and frequency.  Skin: Negative for pallor or rash   Neurological: Negative for weakness, light-headedness, numbness and headaches.  Hematological: Negative for adenopathy. Does not bruise/bleed easily.  Psychiatric/Behavioral: Negative for dysphoric mood. The patient is  not nervous/anxious.         Objective:   Physical Exam  Constitutional: She appears well-developed and well-nourished. No distress.  obese and well appearing   HENT:  Head: Normocephalic and atraumatic.  Right Ear: External ear normal.  Left Ear: External ear normal.  Nose: Nose normal.  Mouth/Throat: Oropharynx is clear and moist.  Eyes: Conjunctivae and EOM are normal. Pupils are equal, round, and reactive to light. Right eye exhibits no discharge. Left eye exhibits no discharge. No scleral icterus.  Neck: Normal range of motion. Neck supple. No JVD present. Carotid bruit is not present. No thyromegaly present.  Cardiovascular: Normal rate, regular rhythm, normal heart sounds and intact distal pulses.    No murmur heard. Pulmonary/Chest: Effort normal and breath sounds normal. No respiratory distress. She has no wheezes. She has no rales.  Abdominal: Soft. Bowel sounds are normal. She exhibits no distension and no mass. There is no tenderness.  Musculoskeletal: Normal range of motion. She exhibits no edema and no tenderness.  Lymphadenopathy:    She has no cervical adenopathy.  Neurological: She is alert. She has normal reflexes. No cranial nerve deficit. She exhibits normal muscle tone. Coordination normal.  Skin: Skin is warm and dry. No erythema. No pallor.  Several 1 cm areas of pink scale (oval) with central clearing on abdomen   Psychiatric: She has a normal mood and affect.  Cheerful and talkative  Not seemingly anxious           Assessment & Plan:

## 2012-12-28 NOTE — Assessment & Plan Note (Addendum)
Pt is off gabapentin currently  Has trouble falling asleep but does not feel overly anxious Disc use of benadryl 25-50 mg at bedtime and update  Also rev sleep hygeine

## 2012-12-28 NOTE — Assessment & Plan Note (Signed)
Reviewed health habits including diet and exercise and skin cancer prevention Also reviewed health mt list, fam hx and immunizations  Rev wellness labs in detail 

## 2012-12-28 NOTE — Assessment & Plan Note (Signed)
Discussed how this problem influences overall health and the risks it imposes  Reviewed plan for weight loss with lower calorie diet (via better food choices and also portion control or program like weight watchers) and exercise building up to or more than 30 minutes 5 days per week including some aerobic activity   Pt is starting to eat healthier foods and try new things- disc inc more green vegetables and less fast food Needs to start exercise

## 2012-12-28 NOTE — Patient Instructions (Addendum)
Try benadryl for sleep as needed 25 to 50 mg at bedtime Get back to exercise - goal is 30 minutes 5 days per week  Continue watching diet and work towards more of the low calorie foods  Tetanus shot today (10 year booster) Take care of yourself

## 2013-02-02 ENCOUNTER — Telehealth: Payer: Self-pay

## 2013-02-02 DIAGNOSIS — M79672 Pain in left foot: Secondary | ICD-10-CM

## 2013-02-02 DIAGNOSIS — M79673 Pain in unspecified foot: Secondary | ICD-10-CM | POA: Insufficient documentation

## 2013-02-02 NOTE — Telephone Encounter (Signed)
Will ref to podiatry  Let her know Shirlee Limerick will be calling

## 2013-02-02 NOTE — Telephone Encounter (Addendum)
Pt left v/m requesting a referral;When woke up 01/27/13 ball of left foot appears swollen and painful; pain is worse when standing or walking and at times pain goes up leg to back of thigh;pt prefers referral instead of appt. No known injury.

## 2013-02-04 NOTE — Telephone Encounter (Signed)
Linda scheduled appt

## 2013-04-29 ENCOUNTER — Other Ambulatory Visit: Payer: Self-pay

## 2013-04-30 ENCOUNTER — Telehealth: Payer: Self-pay

## 2013-04-30 NOTE — Telephone Encounter (Signed)
Pt advised and she will get flu vaccine at work

## 2013-04-30 NOTE — Telephone Encounter (Signed)
Pt left v/m had flu shot 06/26/12 and pt wants to know how late in season she should wait to get flu shot.Please advise.

## 2013-04-30 NOTE — Telephone Encounter (Signed)
Get it as soon as she can

## 2013-07-22 ENCOUNTER — Ambulatory Visit (INDEPENDENT_AMBULATORY_CARE_PROVIDER_SITE_OTHER): Payer: 59 | Admitting: Internal Medicine

## 2013-07-22 ENCOUNTER — Encounter: Payer: Self-pay | Admitting: Internal Medicine

## 2013-07-22 VITALS — BP 112/78 | HR 75 | Temp 98.4°F | Wt 265.5 lb

## 2013-07-22 DIAGNOSIS — J029 Acute pharyngitis, unspecified: Secondary | ICD-10-CM

## 2013-07-22 DIAGNOSIS — J039 Acute tonsillitis, unspecified: Secondary | ICD-10-CM

## 2013-07-22 MED ORDER — AZITHROMYCIN 250 MG PO TABS: ORAL_TABLET | ORAL | Status: DC

## 2013-07-22 NOTE — Progress Notes (Signed)
HPI  Pt presents to the clinic today with c/osore throat. This started 2 days ago. Karla Grant does have an associated cough which is unproductive. Karla Grant denies difficulty swallowing, fever, chills or body aches. Karla Grant has taken OTC Nyquil last night which did seem to help her sleep.  Review of Systems      Past Medical History  Diagnosis Date  . Obesity   . Headache(784.0)   . Anxiety   . Depression   . Hypoglycemia     Family History  Problem Relation Age of Onset  . Diabetes Maternal Grandmother   . Heart disease Maternal Grandfather   . Cancer Paternal Grandfather     cancer    History   Social History  . Marital Status: Married    Spouse Name: N/A    Number of Children: N/A  . Years of Education: N/A   Occupational History  . [employer]    Social History Main Topics  . Smoking status: Never Smoker   . Smokeless tobacco: Not on file  . Alcohol Use: Yes     Comment: Occasional  . Drug Use: No  . Sexual Activity: Not on file   Other Topics Concern  . Not on file   Social History Narrative  . No narrative on file    Allergies  Allergen Reactions  . Eletriptan Hydrobromide     REACTION: chest pain  . Topiramate     REACTION: no help     Constitutional:  Denies headache, fatigue, fever or  abrupt weight changes.  HEENT:  Positive sore throat. Denies eye redness, eye pain, pressure behind the eyes, facial pain, nasal congestion, ear pain, ringing in the ears, wax buildup, runny nose or bloody nose. Respiratory: Positive cough. Denies difficulty breathing or shortness of breath.  Cardiovascular: Denies chest pain, chest tightness, palpitations or swelling in the hands or feet.   No other specific complaints in a complete review of systems (except as listed in HPI above).  Objective:   BP 112/78  Pulse 75  Temp(Src) 98.4 F (36.9 C) (Oral)  Wt 265 lb 8 oz (120.43 kg)  SpO2 98% Wt Readings from Last 3 Encounters:  07/22/13 265 lb 8 oz (120.43 kg)  12/28/12 257  lb 12 oz (116.915 kg)  06/26/12 251 lb 12 oz (114.193 kg)     General: Appears hier stated age, obese but well developed, well nourished in NAD. HEENT: Head: normal shape and size; Eyes: sclera white, no icterus, conjunctiva pink, PERRLA and EOMs intact; Ears: Tm's gray and intact, normal light reflex; Nose: mucosa pink and moist, septum midline; Throat/Mouth: Teeth present, mucosa erythematous and moist, tonsil 3+, no exudate noted, no lesions or ulcerations noted.  Neck: Mild cervical lymphadenopathy. Neck supple, trachea midline. No massses, lumps or thyromegaly present.  Cardiovascular: Normal rate and rhythm. S1,S2 noted.  No murmur, rubs or gallops noted. No JVD or BLE edema. No carotid bruits noted. Pulmonary/Chest: Normal effort and positive vesicular breath sounds. No respiratory distress. No wheezes, rales or ronchi noted.      Assessment & Plan:   Acute pharyngitis and Acute tonsillitis  Likely viral at this point Get some rest and drink plenty of water Do salt water gargles for the sore throat Will give eRx for Azithromax x 5 days in case symptoms get worse over the weekend Continue Nyquil  RTC as needed or if symptoms persist.

## 2013-07-22 NOTE — Patient Instructions (Signed)
Pharyngitis °Pharyngitis is redness, pain, and swelling (inflammation) of your pharynx.  °CAUSES  °Pharyngitis is usually caused by infection. Most of the time, these infections are from viruses (viral) and are part of a cold. However, sometimes pharyngitis is caused by bacteria (bacterial). Pharyngitis can also be caused by allergies. Viral pharyngitis may be spread from person to person by coughing, sneezing, and personal items or utensils (cups, forks, spoons, toothbrushes). Bacterial pharyngitis may be spread from person to person by more intimate contact, such as kissing.  °SIGNS AND SYMPTOMS  °Symptoms of pharyngitis include:   °· Sore throat.   °· Tiredness (fatigue).   °· Low-grade fever.   °· Headache. °· Joint pain and muscle aches. °· Skin rashes. °· Swollen lymph nodes. °· Plaque-like film on throat or tonsils (often seen with bacterial pharyngitis). °DIAGNOSIS  °Your health care provider will ask you questions about your illness and your symptoms. Your medical history, along with a physical exam, is often all that is needed to diagnose pharyngitis. Sometimes, a rapid strep test is done. Other lab tests may also be done, depending on the suspected cause.  °TREATMENT  °Viral pharyngitis will usually get better in 3 4 days without the use of medicine. Bacterial pharyngitis is treated with medicines that kill germs (antibiotics).  °HOME CARE INSTRUCTIONS  °· Drink enough water and fluids to keep your urine clear or pale yellow.   °· Only take over-the-counter or prescription medicines as directed by your health care provider:   °· If you are prescribed antibiotics, make sure you finish them even if you start to feel better.   °· Do not take aspirin.   °· Get lots of rest.   °· Gargle with 8 oz of salt water (½ tsp of salt per 1 qt of water) as often as every 1 2 hours to soothe your throat.   °· Throat lozenges (if you are not at risk for choking) or sprays may be used to soothe your throat. °SEEK MEDICAL  CARE IF:  °· You have large, tender lumps in your neck. °· You have a rash. °· You cough up green, yellow-brown, or bloody spit. °SEEK IMMEDIATE MEDICAL CARE IF:  °· Your neck becomes stiff. °· You drool or are unable to swallow liquids. °· You vomit or are unable to keep medicines or liquids down. °· You have severe pain that does not go away with the use of recommended medicines. °· You have trouble breathing (not caused by a stuffy nose). °MAKE SURE YOU:  °· Understand these instructions. °· Will watch your condition. °· Will get help right away if you are not doing well or get worse. °Document Released: 06/10/2005 Document Revised: 03/31/2013 Document Reviewed: 02/15/2013 °ExitCare® Patient Information ©2014 [employer]. ° °

## 2014-02-10 ENCOUNTER — Ambulatory Visit (INDEPENDENT_AMBULATORY_CARE_PROVIDER_SITE_OTHER): Payer: 59 | Admitting: Internal Medicine

## 2014-02-10 ENCOUNTER — Telehealth: Payer: Self-pay | Admitting: Family Medicine

## 2014-02-10 ENCOUNTER — Encounter: Payer: Self-pay | Admitting: Internal Medicine

## 2014-02-10 VITALS — BP 118/78 | HR 75 | Temp 98.2°F | Wt 266.0 lb

## 2014-02-10 DIAGNOSIS — F411 Generalized anxiety disorder: Secondary | ICD-10-CM

## 2014-02-10 DIAGNOSIS — E162 Hypoglycemia, unspecified: Secondary | ICD-10-CM

## 2014-02-10 DIAGNOSIS — R200 Anesthesia of skin: Secondary | ICD-10-CM

## 2014-02-10 DIAGNOSIS — R002 Palpitations: Secondary | ICD-10-CM

## 2014-02-10 DIAGNOSIS — R079 Chest pain, unspecified: Secondary | ICD-10-CM

## 2014-02-10 DIAGNOSIS — R202 Paresthesia of skin: Secondary | ICD-10-CM

## 2014-02-10 DIAGNOSIS — F419 Anxiety disorder, unspecified: Secondary | ICD-10-CM

## 2014-02-10 DIAGNOSIS — R209 Unspecified disturbances of skin sensation: Secondary | ICD-10-CM

## 2014-02-10 DIAGNOSIS — Z0000 Encounter for general adult medical examination without abnormal findings: Secondary | ICD-10-CM

## 2014-02-10 NOTE — Patient Instructions (Addendum)

## 2014-02-10 NOTE — Progress Notes (Signed)
Subjective:    Patient ID: Karla Grant, female    DOB: Jan 01, 1991, 23 y.o.   MRN: [id number]  HPI  Pt presents to the clinic today with c/o chest pain. She reports this started last week. The pain is intermittent. She reports like it feels like her heart flutters for a few seconds and then stops.  It does radiate into her left arm. She reports the pain in her arms feels like numbness and tingling. She denies neck pain. She denies dizziness, chest tightness or shortness of breath. She does have a PMH that includes obesity, anxiety and depression. She rarely drinks caffeine.  Review of Systems  Past Medical History  Diagnosis Date  . Obesity   . Headache(784.0)   . Anxiety   . Depression   . Hypoglycemia     No current outpatient prescriptions on file.   No current facility-administered medications for this visit.    Allergies  Allergen Reactions  . Eletriptan Hydrobromide     REACTION: chest pain  . Topiramate     REACTION: no help    Family History  Problem Relation Age of Onset  . Diabetes Maternal Grandmother   . Heart disease Maternal Grandfather   . Cancer Paternal Grandfather     cancer    History   Social History  . Marital Status: Married    Spouse Name: N/A    Number of Children: N/A  . Years of Education: N/A   Occupational History  . [employer]    Social History Main Topics  . Smoking status: Never Smoker   . Smokeless tobacco: Not on file  . Alcohol Use: Yes     Comment: Occasional  . Drug Use: No  . Sexual Activity: Not on file   Other Topics Concern  . Not on file   Social History Narrative  . No narrative on file     Constitutional: Denies fever, malaise, fatigue, headache or abrupt weight changes.  Respiratory: Denies difficulty breathing, shortness of breath, cough or sputum production.   Cardiovascular: Pt reports chest pain. Denies chest tightness, palpitations or swelling in the hands or feet.  Neurological: Pt reports numbness  and tingling in left arm. Denies dizziness, difficulty with memory, difficulty with speech or problems with balance and coordination.   No other specific complaints in a complete review of systems (except as listed in HPI above).     Objective:   Physical Exam   BP 118/78  Pulse 75  Temp(Src) 98.2 F (36.8 C) (Oral)  Wt 266 lb (120.657 kg)  SpO2 98%  LMP 01/29/2014 Wt Readings from Last 3 Encounters:  02/10/14 266 lb (120.657 kg)  07/22/13 265 lb 8 oz (120.43 kg)  12/28/12 257 lb 12 oz (116.915 kg)    General: Appears her stated age, obese but well developed, well nourished in NAD. Cardiovascular: Normal rate and rhythm. Distant S1,S2 noted.  No murmur, rubs or gallops noted.  Pulmonary/Chest: Normal effort and positive vesicular breath sounds. No respiratory distress. No wheezes, rales or ronchi noted.  Musculoskeletal: Normal internal and external rotation of the left shoulder. No pain with palpation of the left shoulder. Normal strength BUE. Neurological: Alert and oriented. Cranial nerves II-XII intact. Sensation intact.  BMET    Component Value Date/Time   NA 142 12/18/2012 0752   K 4.0 12/18/2012 0752   CL 105 12/18/2012 0752   CO2 24 12/18/2012 0752   GLUCOSE 90 12/18/2012 0752   BUN 8 12/18/2012 0752  CREATININE 0.64 12/18/2012 0752   CALCIUM 9.1 12/18/2012 0752   GFRNONAA 125 12/18/2012 0752   GFRAA 144 12/18/2012 0752    Lipid Panel     Component Value Date/Time   TRIG 173* 12/18/2012 0752   HDL 41 12/18/2012 0752   CHOLHDL 4.0 12/18/2012 0752   LDLCALC 87 12/18/2012 0752    CBC    Component Value Date/Time   WBC 5.8 12/18/2012 0752   RBC 4.77 12/18/2012 0752   HGB 13.8 12/18/2012 0752   HCT 41.8 12/18/2012 0752   MCV 88 12/18/2012 0752   MCH 28.9 12/18/2012 0752   MCHC 33.0 12/18/2012 0752   RDW 13.3 12/18/2012 0752   LYMPHSABS 2.2 12/18/2012 0752   EOSABS 0.2 12/18/2012 0752   BASOSABS 0.0 12/18/2012 0752    Hgb A1C No results found for this basename: HGBA1C          Assessment & Plan:   Chest pain with radiation to left arm:  ? Anxiety Will obtain ECG- short PR otherwise normal Will check CBC, CMET and TSH today Stop all caffeine If palpitations occur more frequently or last for a longer period of time, let me know, we will consider a Holter monitor  Will call you with the results, RTC as needed or if symptoms persist or worsen

## 2014-02-10 NOTE — Telephone Encounter (Signed)
Patient Information:  Caller Name: Kaydince  Phone: [phone]  Patient: Karla Grant, Karla Grant  Gender: Female  DOB: Jan 04, 1991  Age: 23 Years  PCP: Loura Pardon [facility])  Pregnant: No  Office Follow Up:  Does the office need to follow up with this patient?: No  Instructions For The Office: N/A  RN Note:  Patient calling regarding concerns of transit chest pain which last less than one minute, and occasional  left arm pain.  Symptoms  Reason For Call & Symptoms: transit chest pain now moving to left arm  Reviewed Health History In EMR: Yes  Reviewed Medications In EMR: Yes  Reviewed Allergies In EMR: Yes  Reviewed Surgeries / Procedures: Yes  Date of Onset of Symptoms: 02/04/2014 OB / GYN:  LMP: 01/29/2014  Guideline(s) Used:  Chest Pain  Disposition Per Guideline:   See Today in Office  Reason For Disposition Reached:   Intermittent chest pains persist > 3 days  Advice Given:  Fleeting Chest Pain:  Fleeting chest pains that last only a few seconds and then go away are generally not serious. They may be from pinched muscles or nerves in your chest wall.  Call Back If:  Severe chest pain  Constant chest pain lasting longer than 5 minutes  Difficulty breathing  Fever  You become worse.  Patient Will Follow Care Advice:  YES  Appointment Scheduled:  02/10/2014 08:45:00 Appointment Scheduled Provider:  Webb Silversmith

## 2014-02-10 NOTE — Progress Notes (Signed)
Pre visit review using our clinic review tool, if applicable. No additional management support is needed unless otherwise documented below in the visit note. 

## 2014-02-11 LAB — COMPREHENSIVE METABOLIC PANEL
ALBUMIN: 3.9 g/dL (ref 3.5–5.5)
ALK PHOS: 73 IU/L (ref 39–117)
ALT: 22 IU/L (ref 0–32)
AST: 19 IU/L (ref 0–40)
Albumin/Globulin Ratio: 1.4 (ref 1.1–2.5)
BUN / CREAT RATIO: 17 (ref 8–20)
BUN: 12 mg/dL (ref 6–20)
CALCIUM: 9.4 mg/dL (ref 8.7–10.2)
CO2: 24 mmol/L (ref 18–29)
CREATININE: 0.72 mg/dL (ref 0.57–1.00)
Chloride: 101 mmol/L (ref 97–108)
GFR calc Af Amer: 135 mL/min/{1.73_m2} (ref >59)
GFR, EST NON AFRICAN AMERICAN: 117 mL/min/{1.73_m2} (ref >59)
GLOBULIN, TOTAL: 2.7 g/dL (ref 1.5–4.5)
Glucose: 89 mg/dL (ref 65–99)
Potassium: 4.5 mmol/L (ref 3.5–5.2)
Sodium: 140 mmol/L (ref 134–144)
Total Bilirubin: 0.5 mg/dL (ref 0.0–1.2)
Total Protein: 6.6 g/dL (ref 6.0–8.5)

## 2014-02-11 LAB — CBC
HEMATOCRIT: 43.7 % (ref 34.0–46.6)
HEMOGLOBIN: 14.5 g/dL (ref 11.1–15.9)
MCH: 29.6 pg (ref 26.6–33.0)
MCHC: 33.2 g/dL (ref 31.5–35.7)
MCV: 89 fL (ref 79–97)
Platelets: 339 10*3/uL (ref 150–379)
RBC: 4.9 x10E6/uL (ref 3.77–5.28)
RDW: 13.3 % (ref 12.3–15.4)
WBC: 8.1 10*3/uL (ref 3.4–10.8)

## 2014-02-11 LAB — TSH: TSH: 2.15 u[IU]/mL (ref 0.450–4.500)

## 2014-07-25 ENCOUNTER — Telehealth: Payer: Self-pay | Admitting: Family Medicine

## 2014-07-25 DIAGNOSIS — Z0000 Encounter for general adult medical examination without abnormal findings: Secondary | ICD-10-CM

## 2014-07-25 NOTE — Telephone Encounter (Signed)
-----   Message from Ellamae Sia sent at 07/25/2014  4:06 PM EST ----- Regarding: Lab orders for Tuesday, 2.1.16 Patient is scheduled for CPX labs, please order future labs, Thanks , Karna Christmas

## 2014-07-26 ENCOUNTER — Other Ambulatory Visit (INDEPENDENT_AMBULATORY_CARE_PROVIDER_SITE_OTHER): Payer: 59

## 2014-07-26 DIAGNOSIS — Z0000 Encounter for general adult medical examination without abnormal findings: Secondary | ICD-10-CM

## 2014-07-26 NOTE — Addendum Note (Signed)
Addended by: Ellamae Sia on: 07/26/2014 04:06 PM   Modules accepted: Orders

## 2014-07-26 NOTE — Addendum Note (Signed)
Addended by: Ellamae Sia on: 07/26/2014 03:51 PM   Modules accepted: Orders

## 2014-07-27 ENCOUNTER — Ambulatory Visit (INDEPENDENT_AMBULATORY_CARE_PROVIDER_SITE_OTHER): Payer: 59 | Admitting: Family Medicine

## 2014-07-27 ENCOUNTER — Encounter: Payer: Self-pay | Admitting: Family Medicine

## 2014-07-27 VITALS — BP 118/68 | HR 76 | Temp 98.0°F | Ht 62.0 in | Wt 262.4 lb

## 2014-07-27 DIAGNOSIS — Z0000 Encounter for general adult medical examination without abnormal findings: Secondary | ICD-10-CM

## 2014-07-27 DIAGNOSIS — F341 Dysthymic disorder: Secondary | ICD-10-CM

## 2014-07-27 DIAGNOSIS — E669 Obesity, unspecified: Secondary | ICD-10-CM

## 2014-07-27 LAB — CBC WITH DIFFERENTIAL/PLATELET
BASOS ABS: 0 10*3/uL (ref 0.0–0.2)
Basos: 0 %
Eos: 1 %
Eosinophils Absolute: 0.1 10*3/uL (ref 0.0–0.4)
HCT: 43.9 % (ref 34.0–46.6)
Hemoglobin: 15 g/dL (ref 11.1–15.9)
IMMATURE GRANS (ABS): 0 10*3/uL (ref 0.0–0.1)
IMMATURE GRANULOCYTES: 0 %
LYMPHS ABS: 2.1 10*3/uL (ref 0.7–3.1)
LYMPHS: 22 %
MCH: 29.9 pg (ref 26.6–33.0)
MCHC: 34.2 g/dL (ref 31.5–35.7)
MCV: 88 fL (ref 79–97)
MONOCYTES: 8 %
MONOS ABS: 0.7 10*3/uL (ref 0.1–0.9)
Neutrophils Absolute: 6.6 10*3/uL (ref 1.4–7.0)
Neutrophils Relative %: 69 %
PLATELETS: 363 10*3/uL (ref 150–379)
RBC: 5.02 x10E6/uL (ref 3.77–5.28)
RDW: 13.5 % (ref 12.3–15.4)
WBC: 9.6 10*3/uL (ref 3.4–10.8)

## 2014-07-27 LAB — COMPREHENSIVE METABOLIC PANEL
A/G RATIO: 1.3 (ref 1.1–2.5)
ALT: 30 IU/L (ref 0–32)
AST: 23 IU/L (ref 0–40)
Albumin: 4 g/dL (ref 3.5–5.5)
Alkaline Phosphatase: 74 IU/L (ref 39–117)
BUN / CREAT RATIO: 19 (ref 8–20)
BUN: 14 mg/dL (ref 6–20)
CHLORIDE: 100 mmol/L (ref 97–108)
CO2: 24 mmol/L (ref 18–29)
CREATININE: 0.72 mg/dL (ref 0.57–1.00)
Calcium: 9.7 mg/dL (ref 8.7–10.2)
GFR calc Af Amer: 135 mL/min/{1.73_m2} (ref >59)
GFR calc non Af Amer: 117 mL/min/{1.73_m2} (ref >59)
Globulin, Total: 3.1 g/dL (ref 1.5–4.5)
Glucose: 81 mg/dL (ref 65–99)
Potassium: 4.6 mmol/L (ref 3.5–5.2)
Sodium: 138 mmol/L (ref 134–144)
Total Bilirubin: 0.6 mg/dL (ref 0.0–1.2)
Total Protein: 7.1 g/dL (ref 6.0–8.5)

## 2014-07-27 LAB — LIPID PANEL
CHOL/HDL RATIO: 3.6 ratio (ref 0.0–4.4)
Cholesterol, Total: 190 mg/dL (ref 100–199)
HDL: 53 mg/dL (ref >39)
LDL Calculated: 116 mg/dL — ABNORMAL HIGH (ref 0–99)
TRIGLYCERIDES: 105 mg/dL (ref 0–149)
VLDL CHOLESTEROL CAL: 21 mg/dL (ref 5–40)

## 2014-07-27 LAB — TSH: TSH: 1.22 u[IU]/mL (ref 0.450–4.500)

## 2014-07-27 NOTE — Patient Instructions (Signed)
Take care of yourself  Keep working in getting more vegetables in your diet and also exercise for weight loss  Labs look ok

## 2014-07-27 NOTE — Progress Notes (Signed)
Subjective:    Patient ID: Karla Grant, female    DOB: [birth year], 24 y.o.   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 years there in March   Is trying to take care of herself  Is trying to eat lower carb diet  -(not no carb) - doing that with her mother  Eating salads/ chicken/turkey/nuts  Also fruit  Stopped soda and tea - but it is hard   Trying to build her stamina - bought a stationary bike and starting to use it   Wt is down 4 lb with bmi of 47  Flu vaccine - had that in Nov   HIV screen   Pap last Friday at gyn    Tdap 7/14   Mood - anxiety is worse lately - trying generic zoloft (from her gyn), and was referred to a counselor Jackelyn Hoehn)- plans to go if she can afford it on her benefit card  Started med on Sunday  So far no side effects  Does not feel any different yet   Stressors - change at work to a new team, 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)  Had to put her dog down last year and another dog needed surgery   Goes out occ with friends - but trying to save $     Results for orders placed or performed in visit on 07/26/14  Lipid panel  Result Value Ref Range   Cholesterol, Total 190 100 - 199 mg/dL   Triglycerides 105 0 - 149 mg/dL   HDL 53 >39 mg/dL   VLDL Cholesterol Cal 21 5 - 40 mg/dL   LDL Calculated 116 (H) 0 - 99 mg/dL   Chol/HDL Ratio 3.6 0.0 - 4.4 ratio units  Comprehensive metabolic panel  Result Value Ref Range   Glucose 81 65 - 99 mg/dL   BUN 14 6 - 20 mg/dL   Creatinine, Ser 0.72 0.57 - 1.00 mg/dL   GFR calc non Af Amer 117 >59 mL/min/1.73   GFR calc Af Amer 135 >59 mL/min/1.73   BUN/Creatinine Ratio 19 8 - 20   Sodium 138 134 - 144 mmol/L   Potassium 4.6 3.5 - 5.2 mmol/L   Chloride 100 97 - 108 mmol/L   CO2 24 18 - 29 mmol/L   Calcium 9.7 8.7 - 10.2 mg/dL     Total Protein 7.1 6.0 - 8.5 g/dL   Albumin 4.0 3.5 - 5.5 g/dL   Globulin, Total 3.1 1.5 - 4.5 g/dL   Albumin/Globulin Ratio 1.3 1.1 - 2.5   Total Bilirubin 0.6 0.0 - 1.2 mg/dL   Alkaline Phosphatase 74 39 - 117 IU/L   AST 23 0 - 40 IU/L   ALT 30 0 - 32 IU/L  TSH  Result Value Ref Range   TSH 1.220 0.450 - 4.500 uIU/mL  CBC with Differential/Platelet  Result Value Ref Range   WBC 9.6 3.4 - 10.8 x10E3/uL   RBC 5.02 3.77 - 5.28 x10E6/uL   Hemoglobin 15.0 11.1 - 15.9 g/dL   HCT 43.9 34.0 - 46.6 %   MCV 88 79 - 97 fL   MCH 29.9 26.6 - 33.0 pg   MCHC 34.2 31.5 - 35.7 g/dL   RDW 13.5 12.3 - 15.4 %   Platelets 363 150 - 379  x10E3/uL   Neutrophils Relative % 69 %   Lymphs 22 %   Monocytes 8 %   Eos 1 %   Basos 0 %   Neutrophils Absolute 6.6 1.4 - 7.0 x10E3/uL   Lymphocytes Absolute 2.1 0.7 - 3.1 x10E3/uL   Monocytes Absolute 0.7 0.1 - 0.9 x10E3/uL   Eosinophils Absolute 0.1 0.0 - 0.4 x10E3/uL   Basophils Absolute 0.0 0.0 - 0.2 x10E3/uL   Immature Granulocytes 0 %   Immature Grans (Abs) 0.0 0.0 - 0.1 x10E3/uL     Review of Systems    Review of Systems  Constitutional: Negative for fever, appetite change,  and unexpected weight change.  Eyes: Negative for pain and visual disturbance.  Respiratory: Negative for cough and shortness of breath.   Cardiovascular: Negative for cp or palpitations    Gastrointestinal: Negative for nausea, diarrhea and constipation.  Genitourinary: Negative for urgency and frequency.  Skin: Negative for pallor or rash   Neurological: Negative for weakness, light-headedness, numbness and headaches.  Hematological: Negative for adenopathy. Does not bruise/bleed easily.  Psychiatric/Behavioral: Negative for dysphoric mood. The patient is  nervous/anxious.      Objective:   Physical Exam  Constitutional: She appears well-developed and well-nourished. No distress.  Obese and well appearing   HENT:  Head: Normocephalic and atraumatic.  Right Ear:  External ear normal.  Left Ear: External ear normal.  Nose: Nose normal.  Mouth/Throat: Oropharynx is clear and moist.  Eyes: Conjunctivae and EOM are normal. Pupils are equal, round, and reactive to light. Right eye exhibits no discharge. Left eye exhibits no discharge. No scleral icterus.  Neck: Normal range of motion. Neck supple. No JVD present. Carotid bruit is not present. No thyromegaly present.  Cardiovascular: Normal rate, regular rhythm, normal heart sounds and intact distal pulses.  Exam reveals no gallop.   Pulmonary/Chest: Effort normal and breath sounds normal. No respiratory distress. She has no wheezes. She has no rales.  Abdominal: Soft. Bowel sounds are normal. She exhibits no distension and no mass. There is no tenderness.  Musculoskeletal: She exhibits no edema or tenderness.  Lymphadenopathy:    She has no cervical adenopathy.  Neurological: She is alert. She has normal reflexes. No cranial nerve deficit. She exhibits normal muscle tone. Coordination normal.  Skin: Skin is warm and dry. No rash noted. No erythema. No pallor.  Solar lentigos diffusely   Psychiatric: She has a normal mood and affect.          Assessment & Plan:   Problem List Items Addressed This Visit      Other   DEPRESSION/ANXIETY    Just started zoloft 70- from her gyn- and no side eff  Disc counseling for anxiety and coping mech Reviewed stressors/ coping techniques/symptoms/ support sources/ tx options and side effects in detail today  Will continue to follow       Relevant Medications   sertraline (ZOLOFT) 50 MG tablet   Obesity    Discussed how this problem influences overall health and the risks it imposes  Reviewed plan for weight loss with lower calorie diet (via better food choices and also portion control or program like weight watchers) and exercise building up to or more than 30 minutes 5 days per week including some aerobic activity    Rev plan for wt loss - getting better  with diet       Routine general medical examination at a health care facility - Primary    Reviewed health habits including  diet and exercise and skin cancer prevention Reviewed appropriate screening tests for age  Also reviewed health mt list, fam hx and immunization status , as well as social and family history   Labs reviewed  Diet and plan for wt loss reviewed  utd gyn care

## 2014-07-27 NOTE — Progress Notes (Signed)
Pre visit review using our clinic review tool, if applicable. No additional management support is needed unless otherwise documented below in the visit note. 

## 2014-07-28 NOTE — Assessment & Plan Note (Signed)
Discussed how this problem influences overall health and the risks it imposes  Reviewed plan for weight loss with lower calorie diet (via better food choices and also portion control or program like weight watchers) and exercise building up to or more than 30 minutes 5 days per week including some aerobic activity    Rev plan for wt loss - getting better with diet

## 2014-07-28 NOTE — Assessment & Plan Note (Signed)
Reviewed health habits including diet and exercise and skin cancer prevention Reviewed appropriate screening tests for age  Also reviewed health mt list, fam hx and immunization status , as well as social and family history   Labs reviewed  Diet and plan for wt loss reviewed  utd gyn care

## 2014-07-28 NOTE — Assessment & Plan Note (Signed)
Just started zoloft 50- from her gyn- and no side eff  Disc counseling for anxiety and coping mech Reviewed stressors/ coping techniques/symptoms/ support sources/ tx options and side effects in detail today  Will continue to follow

## 2014-09-21 ENCOUNTER — Encounter: Payer: Self-pay | Admitting: Family Medicine

## 2014-09-21 ENCOUNTER — Ambulatory Visit (INDEPENDENT_AMBULATORY_CARE_PROVIDER_SITE_OTHER): Payer: 59 | Admitting: Family Medicine

## 2014-09-21 VITALS — BP 104/72 | HR 92 | Temp 98.2°F | Ht 62.0 in | Wt 256.2 lb

## 2014-09-21 DIAGNOSIS — J069 Acute upper respiratory infection, unspecified: Secondary | ICD-10-CM | POA: Diagnosis not present

## 2014-09-21 DIAGNOSIS — R05 Cough: Secondary | ICD-10-CM | POA: Diagnosis not present

## 2014-09-21 DIAGNOSIS — R059 Cough, unspecified: Secondary | ICD-10-CM

## 2014-09-21 MED ORDER — HYDROCODONE-HOMATROPINE 5-1.5 MG/5ML PO SYRP: ORAL_SOLUTION | ORAL | Status: DC

## 2014-09-21 MED ORDER — AMOXICILLIN 500 MG PO CAPS: 1000.0000 mg | ORAL_CAPSULE | Freq: Two times a day (BID) | ORAL | Status: DC

## 2014-09-21 NOTE — Progress Notes (Signed)
   Dr. Frederico Hamman T. Aislinn Feliz, MD, [facility] [place], [address] Phone: [phone] Fax: [phone]  09/21/2014  Patient: Karla Grant, MRN: [id number], DOB: Sep 11, 1990, 24 y.o.  Primary Physician:  Loura Pardon, MD  Chief Complaint: Cough; Fever; Sore Throat; and Headache  Subjective:   This 24 y.o. female patient presents with runny nose, sneezing, cough, sore throat, malaise and minimal / low-grade fever . Sore throat on Monday, now that is gone and coughing a lot. It is a little painful with cough.  Non-smoker.  Works at [employer].  + recent exposure to others with similar symptoms.   The patent denies sore throat as the primary complaint. Denies sthortness of breath/wheezing, high fever, chest pain, rhinits for more than 14 days, significant myalgia, otalgia, facial pain, abdominal pain, changes in bowel or bladder.  PMH, PHS, Allergies, Problem List, Medications, Family History, and Social History have all been reviewed.  ROS as above, eating and drinking - tolerating PO. Urinating normally. No excessive vomitting or diarrhea. O/w as above.  Objective:   Blood pressure 104/72, pulse 92, temperature 98.2 F (36.8 C), temperature source Oral, height 5\' 2"  (1.575 m), weight 256 lb 4 oz (116.234 kg), last menstrual period 09/07/2014, SpO2 97 %.  GEN: WDWN, Non-toxic, Atraumatic, normocephalic. A and O x 3. HEENT: Oropharynx clear without exudate, MMM, no significant LAD, mild rhinnorhea Ears: TM clear, COL visualized with good landmarks CV: RRR, no m/g/r. Pulm: CTA B, no wheezes, rhonchi, or crackles, normal respiratory effort. EXT: no c/c/e Psych: well oriented, neither depressed nor anxious in appearance  Objective Data:  Assessment and Plan:   URI (upper respiratory infection)  Cough  Supportive care reviewed with patient. See patient instruction section. Amox if worsens and develops fever over the next  few days  Follow-up: No Follow-up on file.  New Prescriptions   AMOXICILLIN (AMOXIL) 500 MG CAPSULE    Take 2 capsules (1,000 mg total) by mouth 2 (two) times daily.   HYDROCODONE-HOMATROPINE (HYCODAN) 5-1.5 MG/5ML SYRUP    1 tsp po at night before bed prn cough   No orders of the defined types were placed in this encounter.    Signed,  Maud Deed. Daelyn Mozer, MD   Patient's Medications  New Prescriptions   AMOXICILLIN (AMOXIL) 500 MG CAPSULE    Take 2 capsules (1,000 mg total) by mouth 2 (two) times daily.   HYDROCODONE-HOMATROPINE (HYCODAN) 5-1.5 MG/5ML SYRUP    1 tsp po at night before bed prn cough  Previous Medications   SERTRALINE (ZOLOFT) 50 MG TABLET    Take 50 mg by mouth daily.  Modified Medications   No medications on file  Discontinued Medications   No medications on file

## 2014-09-21 NOTE — Progress Notes (Signed)
Pre visit review using our clinic review tool, if applicable. No additional management support is needed unless otherwise documented below in the visit note. 

## 2014-09-21 NOTE — Patient Instructions (Signed)
Excellent Over the Counter Orthotics:  Hapad: available at www.hapad.com (Green Sports Insoles)  SPENCO: Available at some sports stores or MagazineAlert.pl. (I prefer the full-length green orthotic that has a yellow bottom / base)  www.pedifix.com and "[place]" website: multiple good foot products  SHOES: Danskos, Merrells, Keens, Clarks, Birkenstocks - good arch support, want minimal bendability. Finn Comfort also excellent, but even more expensive.  Tennis Shoes / Running Shoes: Many good companies and styles. The most important thing is to get a good fit and wear a shoe that feels good in the store. Walk around in the store. Run if that is something that you can do. Some of the running stores have a treadmill, also.  Brooks, New Balance, Karleen Hampshire are good, but the most important thing is to wear a shoe that fits your foot and feels good.  [employer] in [place]: excellent staff, IT consultant (Running and [employer]), OTC orthotics. On [address] across from the [employer]. For running shoe and athletic shoe fit, they are the best.  Nicer shoes:  Birkenstock shoes, [place]: Excellent selection THE [employer]OE [employer]ET, [place] [place], [place]: They have the largest selection of comfort and supportive shoes that I have ever seen, and their staff is generally quite good in fitting you for shoes. (They will intermittently have sales, mail coupons, and you can look on their website.)

## 2014-09-22 ENCOUNTER — Telehealth: Payer: Self-pay | Admitting: Family Medicine

## 2014-09-22 NOTE — Telephone Encounter (Signed)
Team health scheduled appt with Dr Silvio Pate 09/23/14 at 9:15 AM.

## 2014-09-22 NOTE — Telephone Encounter (Signed)
Patient Name: Karla Grant DOB: June 16, 1991 Initial Comment Caller states [name], saw Dr Lorelei Pont yesterday; has fever of 100.5, codeine cough meds helped the cough but did not help to sleep; took 2 amoxicillin (6am) and 2 ibuprofen (10:40 am) but still has fever; Nurse Assessment Nurse: [name], RN, [name] Date/Time (Eastern Time): 09/22/2014 12:17:41 PM Confirm and document reason for call. If symptomatic, describe symptoms. ---Caller states she was diagnosed with a respiratory infection yesterday and she started Amoxicillin this morning. She developed fever yesterday (100.5 oral today). No severe breathing difficulty. No wheezing. Has the patient traveled out of the country within the last 30 days? ---No Does the patient require triage? ---Yes Related visit to physician within the last 2 weeks? ---Yes Does the PT have any chronic conditions? (i.e. diabetes, asthma, etc.) ---Yes List chronic conditions. ---Upper respiratory infection, Anxiety Did the patient indicate they were pregnant? ---No Guidelines Guideline Title Affirmed Question Affirmed Notes Infection on Antibiotic Follow-up Call [1] Taking antibiotics < 48 hours AND [2] fever still present (SAME) (all triage questions negative) Cough - Acute Productive Cough with cold symptoms (e.g., runny nose, postnasal drip, throat clearing) (all triage questions negative) Chest Pain [1] Chest pain lasting <= 5 minutes AND [2] NO chest pain or cardiac symptoms now (Exceptions: pains lasting a few seconds) Final Disposition User See Physician within [name], RN, [name] states she developed chest pain yesterday that is present even when she is not coughing. No injury in the past 3 days. Appointment scheduled for Dr. Silvio Pate at 9:15am 09/23/14.

## 2014-09-22 NOTE — Telephone Encounter (Signed)
Patient Name: Karla Grant DOB: [birth year] Initial Comment Caller states [name], saw Dr Lorelei Pont yesterday; has fever of 100.5, codeine cough meds helped the cough but did not help to sleep; took 2 amoxicillin (6am) and 2 ibuprofen (10:40 am) but still has fever; Nurse Assessment Nurse: [name], RN, [name] Date/Time (Eastern Time): 09/22/2014 12:17:41 PM Confirm and document reason for call. If symptomatic, describe symptoms. ---Caller states she was diagnosed with a respiratory infection yesterday and she started Amoxicillin this morning. She developed fever yesterday (100.5 oral today). No severe breathing difficulty. No wheezing. Has the patient traveled out of the country within the last 30 days? ---No Does the patient require triage? ---Yes Related visit to physician within the last 2 weeks? ---Yes Does the PT have any chronic conditions? (i.e. diabetes, asthma, etc.) ---Yes List chronic conditions. ---Upper respiratory infection, Anxiety Did the patient indicate they were pregnant? ---No Guidelines Guideline Title Affirmed Question Affirmed Notes Infection on Antibiotic Follow-up Call [1] Taking antibiotics < 48 hours AND [2] fever still present (SAME) (all triage questions negative) Cough - Acute Productive Cough with cold symptoms (e.g., runny nose, postnasal drip, throat clearing) (all triage questions negative) Chest Pain [1] Chest pain lasting <= 5 minutes AND [2] NO chest pain or cardiac symptoms now (Exceptions: pains lasting a few seconds) Final Disposition User See Physician within [place], RN, [name] states she developed chest pain yesterday that is present even when she is not coughing. No injury in the past 3 days. Appointment scheduled for Dr. Silvio Pate at 9:15am 09/23/14.

## 2014-09-22 NOTE — Telephone Encounter (Signed)
PLEASE NOTE: All timestamps contained within this report are represented as [place] Standard Time. CONFIDENTIALTY NOTICE: This fax transmission is intended only for the addressee. It contains information that is legally privileged, confidential or otherwise protected from use or disclosure. If you are not the intended recipient, you are strictly prohibited from reviewing, disclosing, copying using or disseminating any of this information or taking any action in reliance on or regarding this information. If you have received this fax in error, please notify us immediately by telephone so that we can arrange for its return to [place]. Phone: [phone], Toll-Free: [phone], Fax: [phone] Page: 1 of 3 Call Id: [id number] [place] Patient Name: Karla Grant Gender: Female DOB: [birth year] Age: 24 Y 26 D Return Phone Number: [phone] (Primary), [phone] (Secondary) Address: [address] City/State/Zip: [place] [address] Client [facility] - Client Client Site [place], [place] Contact Type Call Call Type Triage / Clinical Relationship To Patient Self Appointment Disposition EMR Appointment Scheduled Info pasted into Epic Yes Return Phone Number [phone] (Secondary) Chief Complaint Cough Initial Comment Caller states [name], saw Dr Lorelei Pont yesterday; has fever of 100.5, codeine cough meds helped the cough but did not help to sleep; took 2 amoxicillin (6am) and 2 ibuprofen (10:40 am) but still has fever; PreDisposition Call a family member Nurse Assessment Nurse: [name], RN, [place] Date/Time (Eastern Time): 09/22/2014 12:17:41 PM Confirm and document reason for call. If symptomatic, describe symptoms. ---Caller states she was diagnosed with a respiratory infection yesterday and she started Amoxicillin  this morning. She developed fever yesterday (100.5 oral today). No severe breathing difficulty. No wheezing. Has the patient traveled out of the country within the last 30 days? ---No Does the patient require triage? ---Yes Related visit to physician within the last 2 weeks? ---Yes Does the PT have any chronic conditions? (i.e. diabetes, asthma, etc.) ---Yes List chronic conditions. ---Upper respiratory infection, Anxiety Did the patient indicate they were pregnant? ---No Guidelines Guideline Title Affirmed Question Affirmed Notes Nurse Date/Time (Eastern Time) Infection on Antibiotic Follow-up Call [1] Taking antibiotics < 48 hours AND [2] fever still present (SAME) (all triage questions negative) [place], RN, [place] 09/22/2014 12:20:38 PM Cough - Acute Productive Cough with cold symptoms (e.g., runny nose, postnasal drip, [place], RN, [place] 09/22/2014 12:24:36 PM PLEASE NOTE: All timestamps contained within this report are represented as [place] Standard Time. CONFIDENTIALTY NOTICE: This fax transmission is intended only for the addressee. It contains information that is legally privileged, confidential or otherwise protected from use or disclosure. If you are not the intended recipient, you are strictly prohibited from reviewing, disclosing, copying using or disseminating any of this information or taking any action in reliance on or regarding this information. If you have received this fax in error, please notify us immediately by telephone so that we can arrange for its return to [place]. Phone: [phone], Toll-Free: [phone], Fax: [phone] Page: 2 of 3 Call Id: [id number] Guidelines Guideline Title Affirmed Question Affirmed Notes Nurse Date/Time Eilene Ghazi Time) throat clearing) (all triage questions negative) Chest Pain [1] Chest pain lasting <= 5 minutes AND [2] NO chest pain or cardiac symptoms now (Exceptions: pains lasting a few seconds) [place], RN, [name]  09/22/2014 12:32:24 PM Disp. Time Eilene Ghazi Time) Disposition Final User 09/22/2014 12:24:12 PM [place], [place] 09/22/2014 12:28:18 PM [place], RN, [facility] 09/22/2014 12:35:04 PM See  Physician within 24 Hours Yes [place], RN, [place] Understands: Yes Disagree/Comply: Personal assistant Understands: Yes Disagree/Comply: Personal assistant Understands: Yes Disagree/Comply: Comply Care Advice Given Per Guideline HOME CARE: You should be able to treat this at home. NOTE TO TRIAGER - Treat the Symptom: * Provided targeted care advice for the patient's symptom * Refer to relevant guideline as needed for specific instructions. REASSURANCE: * You have been taking an antibiotic for less than 48 hours (2 days). * You've told me that your symptoms are about the SAME today (not worse). CONTINUE ANTIBIOTIC: * Continue taking the antibiotic. FEVER MEDICINES: * Treat fevers above 101 F (38.3 C). ACETAMINOPHEN (E.G., TYLENOL): EXPECTED COURSE ON ANTIBIOTICS: * First Day: No improvement expected. May get a little WORSE. * After 24 Hours: You may feel the SAME. Symptoms should stop getting worse. * After 48 Hours (2 Days): Any fever should be gone. Other symptoms may be GETTING BETTER (improved) or be the SAME. * After 72 hours (3 Days): You should be improving. You should not have any fever. All symptoms should be GETTING BETTER (improved). * After Finishing Antibiotics: All symptoms should be gone. You should feel back to normal. CALL BACK IF: * Fever lasts over 48 hours (2 days) on antibiotics * Symptoms not improving over 72 hours (3 days) on antibiotics * You become worse. CARE ADVICE given per Infection on Antibiotics Follow-Up Call (Adult) guideline. * Most bacterial infections do not respond to the first dose of an antibiotic. Often there is no improvement the first day. People gradually feel better after 2 or 3 days on antibiotics. The fever should be gone after 48 hours on  antibiotics. HOME CARE: You should be able to treat this at home. REASSURANCE: * It doesn't sound like a serious cough. * Coughing up mucus is very important for protecting the lungs from pneumonia. HUMIDIFIER: If the air is dry, use a humidifier in the bedroom. (Reason: dry air makes coughs worse) AVOID TOBACCO SMOKE: Smoking or being exposed to smoke makes coughs much worse. PREVENT DEHYDRATION: * Drink adequate liquids. * This will help soothe an irritated or dry throat and loosen up the PLEASE NOTE: All timestamps contained within this report are represented as [place] Standard Time. CONFIDENTIALTY NOTICE: This fax transmission is intended only for the addressee. It contains information that is legally privileged, confidential or otherwise protected from use or disclosure. If you are not the intended recipient, you are strictly prohibited from reviewing, disclosing, copying using or disseminating any of this information or taking any action in reliance on or regarding this information. If you have received this fax in error, please notify us immediately by telephone so that we can arrange for its return to [place]. Phone: [phone], Toll-Free: [phone], Fax: [phone] Page: 3 of 3 Call Id: [id number] Care Advice Given Per Guideline phlegm. EXPECTED COURSE: Viral bronchitis causes a cough for 1 to 3 weeks. Sometimes you may cough up lots of phlegm (mucus). The mucus can normally be white, gray, yellow or green. CALL BACK IF: * Cough lasts over 3 weeks * Continuous coughing persists over 2 hours after cough treatment * Difficulty breathing occurs * Fever over 103 F (39.4 C) * Fever lasts over 3 days * You become worse. CARE ADVICE given per Cough - Acute Productive (Adult) guideline. FOR A STUFFY NOSE - USE NASAL WASHES: * Introduction: Saline (salt water) nasal irrigation (nasal wash) is an effective and simple home remedy for treating stuffy nose and sinus congestion. The nose  can be  irrigated by pouring, spraying, or squirting salt water into the nose and then letting it run back out. * How it Helps: The salt water rinses out excess mucus, washes out any irritants (dust, allergens) that might be present, and moistens the nasal cavity. FEVER MEDICINES: ACETAMINOPHEN (E.G., TYLENOL): CALL BACK IF: * Fever lasts over 3 days * Nasal discharge lasts over 10 days * Earache or facial pain develops * You become worse. SEE PHYSICIAN WITHIN 24 HOURS: * IF OFFICE WILL BE OPEN: You need to be examined within the next 24 hours. Call your doctor when the office opens, and make an appointment. CALL BACK IF: * Difficulty breathing occurs * Chest pain increases in frequency, duration or severity * Chest pain lasts over 5 minutes * You become worse. CARE ADVICE given per Chest Pain (Adult) guideline. After Care Instructions Given Call Event Type User Date / Time Description Comments User: Berton Mount, RN Date/Time Eilene Ghazi Time): 09/22/2014 12:31:59 PM Caller states she developed chest pain yesterday that is present even when she is not coughing. No injury in the past 3 days. User: Berton Mount, RN Date/Time Eilene Ghazi Time): 09/22/2014 12:40:53 PM Appointment scheduled for Dr. Silvio Pate at 9:15am 09/23/14. Referrals REFERRED TO PCP OFFICE

## 2014-09-22 NOTE — Telephone Encounter (Signed)
Will evaluate then 

## 2014-09-23 ENCOUNTER — Encounter: Payer: Self-pay | Admitting: Internal Medicine

## 2014-09-23 ENCOUNTER — Ambulatory Visit (INDEPENDENT_AMBULATORY_CARE_PROVIDER_SITE_OTHER): Payer: 59 | Admitting: Internal Medicine

## 2014-09-23 VITALS — BP 122/72 | HR 81 | Temp 98.7°F | Resp 16 | Wt 258.0 lb

## 2014-09-23 DIAGNOSIS — J069 Acute upper respiratory infection, unspecified: Secondary | ICD-10-CM

## 2014-09-23 DIAGNOSIS — J019 Acute sinusitis, unspecified: Secondary | ICD-10-CM | POA: Insufficient documentation

## 2014-09-23 NOTE — Progress Notes (Signed)
   Subjective:    Patient ID: Karla Grant, female    DOB: Oct 11, 1990, 24 y.o.   MRN: [id number]  HPI Here for ongoing cough  Started as itchy throat and slight fever about 4 days ago Seen 2 days ago Started the antibiotic but still low grade fever to 100.3  Awoke last night in coughing fit Had some SOB with this fit---not otherwise  No night sweats, shakes or chills Feels hot this am  Headache yesterday Some pressure in head  Tried to use saline nasal spray--no clear help Ibuprofen also-- may have helped fever  Current Outpatient Prescriptions on File Prior to Visit  Medication Sig Dispense Refill  . amoxicillin (AMOXIL) 500 MG capsule Take 2 capsules (1,000 mg total) by mouth 2 (two) times daily. 40 capsule 0  . HYDROcodone-homatropine (HYCODAN) 5-1.5 MG/5ML syrup 1 tsp po at night before bed prn cough 180 mL 0  . sertraline (ZOLOFT) 50 MG tablet Take 50 mg by mouth daily.     No current facility-administered medications on file prior to visit.    Allergies  Allergen Reactions  . Eletriptan Hydrobromide     REACTION: chest pain  . Topiramate     REACTION: no help    Past Medical History  Diagnosis Date  . Obesity   . Headache(784.0)   . Anxiety   . Depression   . Hypoglycemia     No past surgical history on file.  Family History  Problem Relation Age of Onset  . Diabetes Maternal Grandmother   . Heart disease Maternal Grandfather   . Cancer Paternal Grandfather     cancer    History   Social History  . Marital Status: Married    Spouse Name: N/A  . Number of Children: N/A  . Years of Education: N/A   Occupational History  . [employer]    Social History Main Topics  . Smoking status: Never Smoker   . Smokeless tobacco: Never Used  . Alcohol Use: 0.0 oz/week    0 Standard drinks or equivalent per week     Comment: Occasional  . Drug Use: No  . Sexual Activity: Not on file   Other Topics Concern  . 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 No distress.  HENT:  No sinus tenderness Marked nasal inflammation 2+ tonsils with faint pink appearance but no exudates TMs normal  Neck: Normal range of motion. Neck supple. No thyromegaly present.  Pulmonary/Chest: Effort normal and breath sounds normal. No respiratory distress. She has no wheezes. She has no rales.  Lymphadenopathy:    She has no cervical adenopathy.          Assessment & Plan:

## 2014-09-23 NOTE — Assessment & Plan Note (Signed)
No clear response from the antibiotic but worth continuing at this point No signs pneumonia  Discussed symptom Rx for cough and sore throat Finish the amoxil Discussed that this could still last 7-10 days or so

## 2014-09-23 NOTE — Progress Notes (Signed)
Pre visit review using our clinic review tool, if applicable. No additional management support is needed unless otherwise documented below in the visit note. 

## 2015-07-29 ENCOUNTER — Telehealth: Payer: Self-pay | Admitting: Family Medicine

## 2015-07-29 DIAGNOSIS — Z0000 Encounter for general adult medical examination without abnormal findings: Secondary | ICD-10-CM

## 2015-07-29 NOTE — Telephone Encounter (Signed)
-----   Message from Ellamae Sia sent at 07/24/2015  9:59 AM EST ----- Regarding: Lab orders for Monday, 2.6.17 Patient is scheduled for CPX labs, please order future labs, Thanks , Karna Christmas

## 2015-07-31 ENCOUNTER — Other Ambulatory Visit (INDEPENDENT_AMBULATORY_CARE_PROVIDER_SITE_OTHER): Payer: 59

## 2015-07-31 DIAGNOSIS — Z0000 Encounter for general adult medical examination without abnormal findings: Secondary | ICD-10-CM

## 2015-08-01 LAB — COMPREHENSIVE METABOLIC PANEL
ALBUMIN: 3.5 g/dL (ref 3.5–5.5)
ALK PHOS: 68 IU/L (ref 39–117)
ALT: 17 IU/L (ref 0–32)
AST: 18 IU/L (ref 0–40)
Albumin/Globulin Ratio: 1.2 (ref 1.1–2.5)
BUN / CREAT RATIO: 19 (ref 8–20)
BUN: 12 mg/dL (ref 6–20)
Bilirubin Total: 0.3 mg/dL (ref 0.0–1.2)
CO2: 19 mmol/L (ref 18–29)
CREATININE: 0.62 mg/dL (ref 0.57–1.00)
Calcium: 8.7 mg/dL (ref 8.7–10.2)
Chloride: 103 mmol/L (ref 96–106)
GFR calc Af Amer: 144 mL/min/{1.73_m2} (ref >59)
GFR calc non Af Amer: 125 mL/min/{1.73_m2} (ref >59)
GLUCOSE: 90 mg/dL (ref 65–99)
Globulin, Total: 2.9 g/dL (ref 1.5–4.5)
Potassium: 4.1 mmol/L (ref 3.5–5.2)
Sodium: 140 mmol/L (ref 134–144)
TOTAL PROTEIN: 6.4 g/dL (ref 6.0–8.5)

## 2015-08-01 LAB — CBC WITH DIFFERENTIAL/PLATELET
BASOS ABS: 0 10*3/uL (ref 0.0–0.2)
Basos: 0 %
EOS (ABSOLUTE): 0.2 10*3/uL (ref 0.0–0.4)
EOS: 2 %
HEMOGLOBIN: 13.4 g/dL (ref 11.1–15.9)
Hematocrit: 40.6 % (ref 34.0–46.6)
IMMATURE GRANS (ABS): 0 10*3/uL (ref 0.0–0.1)
Immature Granulocytes: 0 %
LYMPHS ABS: 2.1 10*3/uL (ref 0.7–3.1)
Lymphs: 26 %
MCH: 28.8 pg (ref 26.6–33.0)
MCHC: 33 g/dL (ref 31.5–35.7)
MCV: 87 fL (ref 79–97)
MONOCYTES: 10 %
Monocytes Absolute: 0.8 10*3/uL (ref 0.1–0.9)
NEUTROS ABS: 5.1 10*3/uL (ref 1.4–7.0)
Neutrophils: 62 %
PLATELETS: 309 10*3/uL (ref 150–379)
RBC: 4.66 x10E6/uL (ref 3.77–5.28)
RDW: 13.1 % (ref 12.3–15.4)
WBC: 8.2 10*3/uL (ref 3.4–10.8)

## 2015-08-01 LAB — LIPID PANEL
CHOLESTEROL TOTAL: 155 mg/dL (ref 100–199)
Chol/HDL Ratio: 3.3 ratio units (ref 0.0–4.4)
HDL: 47 mg/dL (ref >39)
LDL CALC: 94 mg/dL (ref 0–99)
Triglycerides: 69 mg/dL (ref 0–149)
VLDL CHOLESTEROL CAL: 14 mg/dL (ref 5–40)

## 2015-08-01 LAB — TSH: TSH: 2.7 u[IU]/mL (ref 0.450–4.500)

## 2015-08-02 ENCOUNTER — Encounter: Payer: 59 | Admitting: Family Medicine

## 2015-08-09 ENCOUNTER — Ambulatory Visit (INDEPENDENT_AMBULATORY_CARE_PROVIDER_SITE_OTHER): Payer: 59 | Admitting: Family Medicine

## 2015-08-09 ENCOUNTER — Encounter: Payer: Self-pay | Admitting: Family Medicine

## 2015-08-09 VITALS — BP 122/78 | HR 70 | Temp 98.6°F | Ht 61.0 in | Wt 260.8 lb

## 2015-08-09 DIAGNOSIS — Z0000 Encounter for general adult medical examination without abnormal findings: Secondary | ICD-10-CM | POA: Diagnosis not present

## 2015-08-09 DIAGNOSIS — F341 Dysthymic disorder: Secondary | ICD-10-CM | POA: Diagnosis not present

## 2015-08-09 NOTE — Progress Notes (Signed)
Pre visit review using our clinic review tool, if applicable. No additional management support is needed unless otherwise documented below in the visit note. 

## 2015-08-09 NOTE — Progress Notes (Signed)
Subjective:    Patient ID: Karla Grant, female    DOB: Sep 08, 1990, 25 y.o.   MRN: [id number]  HPI Here for health maintenance exam and to review chronic medical problems    Working a lot - still at [employer] and [employer] part time and [employer] part time  Is bored - she has investigating other positions at [employer] in the future   Doing ok overall  Saw Otelia Sergeant for her anxiety - (worse lately) - wants to work on it w/o medication if possible  Off zoloft since summer - would like to stay off medicine  Has been given some literature to read on calming thoughts    Wt is up 2 lb with bmi of 49 No time to exercise with 3 jobs  Had lost down to 247 and then gained her back  Breakfast is hard to choose healthy options  Tries to pack a lunch  Very picky eater - is trying to add things -- started eating sushi , [place] , some more vegetables  Trying to choose leaner protein  Eating salads before   Flu shot 11/16   HIV -declines - she screens at her gyn  Pap 1/16 gyn- has next one scheduled in mid March  Periods are not bad - no OC   Headaches have been better lately   Td 7/14   Results for orders placed or performed in visit on 07/31/15  CBC with Differential/Platelet  Result Value Ref Range   WBC 8.2 3.4 - 10.8 x10E3/uL   RBC 4.66 3.77 - 5.28 x10E6/uL   Hemoglobin 13.4 11.1 - 15.9 g/dL   Hematocrit 40.6 34.0 - 46.6 %   MCV 87 79 - 97 fL   MCH 28.8 26.6 - 33.0 pg   MCHC 33.0 31.5 - 35.7 g/dL   RDW 13.1 12.3 - 15.4 %   Platelets 309 150 - 379 x10E3/uL   Neutrophils 62 %   Lymphs 26 %   Monocytes 10 %   Eos 2 %   Basos 0 %   Neutrophils Absolute 5.1 1.4 - 7.0 x10E3/uL   Lymphocytes Absolute 2.1 0.7 - 3.1 x10E3/uL   Monocytes Absolute 0.8 0.1 - 0.9 x10E3/uL   EOS (ABSOLUTE) 0.2 0.0 - 0.4 x10E3/uL   Basophils Absolute 0.0 0.0 - 0.2 x10E3/uL   Immature Granulocytes 0 %   Immature Grans (Abs) 0.0 0.0 - 0.1 x10E3/uL  Comprehensive metabolic panel  Result Value Ref Range   Glucose 90 65 - 99 mg/dL   BUN 12 6 - 20 mg/dL   Creatinine, Ser 0.62 0.57 - 1.00 mg/dL   GFR calc non Af Amer 125 >59 mL/min/1.73   GFR calc Af Amer 144 >59 mL/min/1.73   BUN/Creatinine Ratio 19 8 - 20   Sodium 140 134 - 144 mmol/L   Potassium 4.1 3.5 - 5.2 mmol/L   Chloride 103 96 - 106 mmol/L   CO2 19 18 - 29 mmol/L   Calcium 8.7 8.7 - 10.2 mg/dL   Total Protein 6.4 6.0 - 8.5 g/dL   Albumin 3.5 3.5 - 5.5 g/dL   Globulin, Total 2.9 1.5 - 4.5 g/dL   Albumin/Globulin Ratio 1.2 1.1 - 2.5   Bilirubin Total 0.3 0.0 - 1.2 mg/dL   Alkaline Phosphatase 68 39 - 117 IU/L   AST 18 0 - 40 IU/L   ALT 17 0 - 32 IU/L  Lipid panel  Result Value Ref Range   Cholesterol, Total 155 100 - 199 mg/dL  Triglycerides 69 0 - 149 mg/dL   HDL 47 >39 mg/dL   VLDL Cholesterol Cal 14 5 - 40 mg/dL   LDL Calculated 94 0 - 99 mg/dL   Chol/HDL Ratio 3.3 0.0 - 4.4 ratio units  TSH  Result Value Ref Range   TSH 2.700 0.450 - 4.500 uIU/mL    Cholesterol profile is not bad  HDL down a little -not exercising    Patient Active Problem List   Diagnosis Date Noted  . Insomnia 12/28/2012  . TMJ (temporomandibular joint disorder) 12/02/2011  . Migraine 11/08/2011  . Routine general medical examination at a health care facility 07/21/2011  . Morbid obesity ([place]) 03/09/2010  . DEPRESSION/ANXIETY 08/16/2009  . HYPOGLYCEMIA 02/10/2009  . 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 Obesity   . Headache(784.0)   . Anxiety   . Depression   . Hypoglycemia    No past surgical history on file. Social History  Substance Use Topics  . Smoking status: Never Smoker   . Smokeless tobacco: Never Used  . Alcohol Use: 0.0 oz/week    0 Standard drinks or equivalent per week     Comment: Occasional   Family History  Problem Relation Age of Onset  . Diabetes Maternal Grandmother   . Heart disease Maternal Grandfather   . Cancer Paternal Grandfather     cancer   Allergies  Allergen  Reactions  . Eletriptan Hydrobromide     REACTION: chest pain  . Topiramate     REACTION: no help   No current outpatient prescriptions on file prior to visit.   No current facility-administered medications on file prior to visit.    Review of Systems Review of Systems  Constitutional: Negative for fever, appetite change, fatigue and unexpected weight change.  Eyes: Negative for pain and visual disturbance.  Respiratory: Negative for cough and shortness of breath.   Cardiovascular: Negative for cp or palpitations    Gastrointestinal: Negative for nausea, diarrhea and constipation.  Genitourinary: Negative for urgency and frequency.  Skin: Negative for pallor or rash   Neurological: Negative for weakness, light-headedness, numbness and pos for occ headaches.  Hematological: Negative for adenopathy. Does not bruise/bleed easily.  Psychiatric/Behavioral: Negative for dysphoric mood. The patient is nervous/anxious.         Objective:   Physical Exam  Constitutional: She appears well-developed and well-nourished. No distress.  Morbidly obese and well app  HENT:  Head: Normocephalic and atraumatic.  Right Ear: External ear normal.  Left Ear: External ear normal.  Mouth/Throat: Oropharynx is clear and moist.  Eyes: Conjunctivae and EOM are normal. Pupils are equal, round, and reactive to light. No scleral icterus.  Neck: Normal range of motion. Neck supple. No JVD present. Carotid bruit is not present. No thyromegaly present.  Cardiovascular: Normal rate, regular rhythm, normal heart sounds and intact distal pulses.  Exam reveals no gallop.   Pulmonary/Chest: Effort normal and breath sounds normal. No respiratory distress. She has no wheezes. She exhibits no tenderness.  Abdominal: Soft. Bowel sounds are normal. She exhibits no distension, no abdominal bruit and no mass. There is no tenderness.  Genitourinary: No breast swelling, tenderness, discharge or bleeding.  Breast exam: No  mass, nodules, thickening, tenderness, bulging, retraction, inflamation, nipple discharge or skin changes noted.  No axillary or clavicular LA.      Musculoskeletal: Normal range of motion. She exhibits no edema or tenderness.  Lymphadenopathy:    She has no cervical adenopathy.  Neurological: She is alert. She has normal reflexes. No cranial nerve deficit. She exhibits normal muscle tone. Coordination normal.  Skin: Skin is warm and dry. No rash noted. No erythema. No pallor.  Psychiatric: She has a normal mood and affect.          Assessment & Plan:   Problem List Items Addressed This Visit      Other   DEPRESSION/ANXIETY    More anx lately Off zoloft -would like to avoid pharmacotx  Will continue counseling and update Reviewed stressors/ coping techniques/symptoms/ support sources/ tx options and side effects in detail today       Morbid obesity ([place]) - Primary    Discussed how this problem influences overall health and the risks it imposes  Reviewed plan for weight loss with lower calorie diet (via better food choices and also portion control or program like weight watchers) and exercise building up to or more than 30 minutes 5 days per week including some aerobic activity   Pt is eating better - but needs to add exercise (unfortunatley with 3 jobs schedule will not allow it now)  This would also help her anxiety  Disc med opt like Contrave or Belviq - when she is stable with anxiety      Routine general medical examination at a health care facility    Reviewed health habits including diet and exercise and skin cancer prevention Reviewed appropriate screening tests for age  Also reviewed health mt list, fam hx and immunization status , as well as social and family history   See HPI Lab reviewed Try to eat a healthy diet  Avoid sweet drinks/sodas - unless sugar free I'm glad you are making better choices  Fit in an exercise program when you can  See the counselor for  anxiety  Contrave or Belviq may be fair choices for weight loss medications in the future  Labs look good  Take care of yourself  See the gyn as planned

## 2015-08-09 NOTE — Patient Instructions (Signed)
Try to eat a healthy diet  Avoid sweet drinks/sodas - unless sugar free I'm glad you are making better choices  Fit in an exercise program when you can  See the counselor for anxiety  Contrave or Belviq may be fair choices for weight loss medications in the future  Labs look good  Take care of yourself  See the gyn as planned

## 2015-08-10 NOTE — Assessment & Plan Note (Signed)
Discussed how this problem influences overall health and the risks it imposes  Reviewed plan for weight loss with lower calorie diet (via better food choices and also portion control or program like weight watchers) and exercise building up to or more than 30 minutes 5 days per week including some aerobic activity   Pt is eating better - but needs to add exercise (unfortunatley with 3 jobs schedule will not allow it now)  This would also help her anxiety  Disc med opt like Contrave or Belviq - when she is stable with anxiety

## 2015-08-10 NOTE — Assessment & Plan Note (Signed)
Reviewed health habits including diet and exercise and skin cancer prevention Reviewed appropriate screening tests for age  Also reviewed health mt list, fam hx and immunization status , as well as social and family history   See HPI Lab reviewed Try to eat a healthy diet  Avoid sweet drinks/sodas - unless sugar free I'm glad you are making better choices  Fit in an exercise program when you can  See the counselor for anxiety  Contrave or Belviq may be fair choices for weight loss medications in the future  Labs look good  Take care of yourself  See the gyn as planned

## 2015-08-10 NOTE — Assessment & Plan Note (Signed)
More anx lately Off zoloft -would like to avoid pharmacotx  Will continue counseling and update Reviewed stressors/ coping techniques/symptoms/ support sources/ tx options and side effects in detail today

## 2015-10-19 ENCOUNTER — Encounter: Payer: Self-pay | Admitting: Internal Medicine

## 2015-10-19 ENCOUNTER — Ambulatory Visit (INDEPENDENT_AMBULATORY_CARE_PROVIDER_SITE_OTHER): Payer: 59 | Admitting: Internal Medicine

## 2015-10-19 VITALS — BP 122/78 | HR 76 | Temp 98.5°F | Wt 256.0 lb

## 2015-10-19 DIAGNOSIS — M26629 Arthralgia of temporomandibular joint, unspecified side: Secondary | ICD-10-CM

## 2015-10-19 NOTE — Progress Notes (Signed)
Pre visit review using our clinic review tool, if applicable. No additional management support is needed unless otherwise documented below in the visit note. 

## 2015-10-19 NOTE — Progress Notes (Signed)
Subjective:    Patient ID: Karla Grant, female    DOB: 24-Feb-1991, 25 y.o.   MRN: [id number]  HPI  Pt presents to the clinic today with c/o left jaw/ear pain which has gradually been improving. This started 1 week ago. Pain presents when patient opens her mouth, chews, and clenches her teeth. She denies pain in her neck, fevers, chills, or jaw being stuck open. She denies hearing loss or dizziness. 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 She admits she grinds her teeth, but has been wearing a mouthguard the last few nights. She sees a dentist biannually, last visit was in 06/2015. She has not taken anything OTC.  Review of Systems      Past Medical History  Diagnosis Date  . Obesity   . Headache(784.0)   . Anxiety   . Depression   . Hypoglycemia     Current Outpatient Prescriptions  Medication Sig Dispense Refill  . sertraline (ZOLOFT) 50 MG tablet Take 100 mg by mouth daily.      No current facility-administered medications for this visit.    Allergies  Allergen Reactions  . Eletriptan Hydrobromide     REACTION: chest pain  . Topiramate     REACTION: no help    Family History  Problem Relation Age of Onset  . Diabetes Maternal Grandmother   . Heart disease Maternal Grandfather   . Cancer Paternal Grandfather     cancer    Social History   Social History  . Marital Status: Married    Spouse Name: N/A  . Number of Children: N/A  . Years of Education: N/A   Occupational History  . [employer]    Social History Main Topics  . Smoking status: Never Smoker   . Smokeless tobacco: Never Used  . Alcohol Use: 0.0 oz/week    0 Standard drinks or equivalent per week     Comment: Occasional  . Drug Use: No  . Sexual Activity: Not on file   Other Topics Concern  . Not on file   Social History Narrative     Constitutional: Denies fever, fatigue, or headache . HEENT: Pt reports left ear pain. Denies tooth pain. Denies ringing in the  ears, wax buildup, runny nose, nasal congestion, bloody nose, or sore throat. Respiratory: Denies cough    MSK: Pt reports left jaw pain. Denies swelling.  No other specific complaints in a complete review of systems (except as listed in HPI above).  Objective:   Physical Exam   BP 122/78 mmHg  Pulse 76  Temp(Src) 98.5 F (36.9 C) (Oral)  Wt 256 lb (116.121 kg)  SpO2 97%  LMP 09/21/2015 Wt Readings from Last 3 Encounters:  10/19/15 256 lb (116.121 kg)  08/09/15 260 lb 12 oz (118.275 kg)  09/23/14 258 lb (117.028 kg)    General: Appears her stated age, obese and in NAD. Skin: Warm, dry and intact.  HEENT: Head: normal shape and size. No signs of redness, swelling or warmth over left jaw;  Ears: Tm's gray and intact, normal light reflex;  Throat/Mouth: TTP of left sided jaw near TMJ. No popping or cracking with mouth opening. Teeth present, mucosa pink and moist, no exudate, lesions or ulcerations noted.  Neck:  Neck supple, trachea midline. No masses, lumps or thyromegaly present.     BMET    Component Value Date/Time   NA 140 07/31/2015 0804   K 4.1 07/31/2015 0804  CL 103 07/31/2015 0804   CO2 19 07/31/2015 0804   GLUCOSE 90 07/31/2015 0804   BUN 12 07/31/2015 0804   CREATININE 0.62 07/31/2015 0804   CALCIUM 8.7 07/31/2015 0804   GFRNONAA 125 07/31/2015 0804   GFRAA 144 07/31/2015 0804    Lipid Panel     Component Value Date/Time   CHOL 155 07/31/2015 0804   TRIG 69 07/31/2015 0804   HDL 47 07/31/2015 0804   CHOLHDL 3.3 07/31/2015 0804   LDLCALC 94 07/31/2015 0804    CBC    Component Value Date/Time   WBC 8.2 07/31/2015 0804   RBC 4.66 07/31/2015 0804   HGB 15.0 07/26/2014 0000   HCT 40.6 07/31/2015 0804   HCT 43.9 07/26/2014 0000   PLT 309 07/31/2015 0804   PLT 363 07/26/2014 0000   MCV 87 07/31/2015 0804   MCV 88 07/26/2014 0000   MCH 28.8 07/31/2015 0804   MCHC 33.0 07/31/2015 0804   RDW 13.1 07/31/2015 0804   LYMPHSABS 2.1 07/31/2015 0804    EOSABS 0.2 07/31/2015 0804   EOSABS 0.1 07/26/2014 0000   BASOSABS 0.0 07/31/2015 0804    Hgb A1C No results found for: HGBA1C      Assessment & Plan:   L sided TMJ disorder:  Ibuprofen PRN for pain Jaw rest Continue nightguard  If sxs persist or worsen, f/u with dentist

## 2015-10-19 NOTE — Patient Instructions (Signed)

## 2016-03-15 ENCOUNTER — Ambulatory Visit (INDEPENDENT_AMBULATORY_CARE_PROVIDER_SITE_OTHER): Payer: 59 | Admitting: Family Medicine

## 2016-03-15 ENCOUNTER — Encounter: Payer: Self-pay | Admitting: Family Medicine

## 2016-03-15 VITALS — BP 116/74 | HR 83 | Temp 98.2°F | Ht 61.0 in | Wt 263.0 lb

## 2016-03-15 DIAGNOSIS — F341 Dysthymic disorder: Secondary | ICD-10-CM | POA: Diagnosis not present

## 2016-03-15 DIAGNOSIS — Z23 Encounter for immunization: Secondary | ICD-10-CM | POA: Diagnosis not present

## 2016-03-15 NOTE — Progress Notes (Signed)
Pre visit review using our clinic review tool, if applicable. No additional management support is needed unless otherwise documented below in the visit note. 

## 2016-03-15 NOTE — Patient Instructions (Signed)
Flu shot today  Keep working on weight loss - work with your coach  Be patient of yourself  Exercise as much as can - 30 minutes a day  I'm glad you are doing well

## 2016-03-15 NOTE — Progress Notes (Signed)
Subjective:    Patient ID: Karla Grant, female    DOB: [birth year], 25 y.o.   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, art   She is doing a weight loss challenge at work  [employer] on work scale was 4 today and at home 255  Has lost 6 lb since starting the program at the start of this month- she is excited about this   Also has a weight loss coach -helpful   Trying not to snack  Sweet potatoes  Salad and meat  Cutting carbs/ changing to better carbs Some sushi  Gave up sweet tea and soda  A little sugar in her coffee-not a lot  Getting protein with every meal   Doing better with fruits-starting to like them  Is prepping meals ahead of time /packing them  Has been eating salads / green beans  Will eat black and refried beans too  Also hummus  Less processed food   Walking at work - breaks when she can   Mood is good overall - she is still taking zoloft  Had a bout of depression in the summer - her dogs keep getting sick and the bills are piling up  Financial stress  Anxiety is doing well  Was going to counseling   Now has [place] IUD  Hopefully periods will stop soon   Patient Active Problem List   Diagnosis Date Noted  . Insomnia 12/28/2012  . TMJ (temporomandibular joint disorder) 12/02/2011  . Migraine 11/08/2011  . Routine general medical examination at a health care facility 07/21/2011  . Morbid obesity ([place]) 03/09/2010  . DEPRESSION/ANXIETY 08/16/2009  . HYPOGLYCEMIA 02/10/2009  . 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 Anxiety   . Depression   . Headache(784.0)   . Hypoglycemia   . Obesity    No past surgical history on file. Social  History  Substance Use Topics  . Smoking status: Never Smoker  . Smokeless tobacco: Never Used  . Alcohol use 0.0 oz/week     Comment: Occasional   Family History  Problem Relation Age of Onset  . Diabetes Maternal Grandmother   . Heart disease Maternal Grandfather   . Cancer Paternal Grandfather     cancer   Allergies  Allergen Reactions  . Eletriptan Hydrobromide     REACTION: chest pain  . Topiramate     REACTION: no help   Current Outpatient Prescriptions on File Prior to Visit  Medication Sig Dispense Refill  . sertraline (ZOLOFT) 50 MG tablet Take 100 mg by mouth daily.      No current facility-administered medications on file prior to visit.     Review of Systems Review of Systems  Constitutional: Negative for fever, appetite change, fatigue and unexpected weight change.  Eyes: Negative for pain and visual disturbance.  Respiratory: Negative for cough and shortness of breath.   Cardiovascular: Negative for cp or palpitations    Gastrointestinal: Negative for nausea, diarrhea and constipation.  Genitourinary: Negative for urgency and frequency.  Skin: Negative for pallor or rash   Neurological: Negative for weakness,  light-headedness, numbness and headaches.  Hematological: Negative for adenopathy. Does not bruise/bleed easily.  Psychiatric/Behavioral: Negative for dysphoric mood. The patient is not nervous/anxious.         Objective:   Physical Exam  Constitutional: She appears well-developed and well-nourished. No distress.  Morbidly obese and well appearing   HENT:  Head: Normocephalic and atraumatic.  Mouth/Throat: Oropharynx is clear and moist.  Eyes: Conjunctivae and EOM are normal. Pupils are equal, round, and reactive to light.  Neck: Normal range of motion. Neck supple. No JVD present. Carotid bruit is not present. No thyromegaly present.  Cardiovascular: Normal rate, regular rhythm, normal heart sounds and intact distal pulses.  Exam reveals no  gallop.   Pulmonary/Chest: Effort normal and breath sounds normal. No respiratory distress. She has no wheezes. She has no rales.  No crackles  Abdominal: Soft. Bowel sounds are normal. She exhibits no distension, no abdominal bruit and no mass. There is no tenderness.  Musculoskeletal: She exhibits no edema.  Lymphadenopathy:    She has no cervical adenopathy.  Neurological: She is alert. She has normal reflexes.  Skin: Skin is warm and dry. No rash noted.  Psychiatric: She has a normal mood and affect. Her speech is normal and behavior is normal. Thought content normal. Her mood appears not anxious. Her affect is not blunt and not labile. Thought content is not paranoid. She does not exhibit a depressed mood. She expresses no homicidal and no suicidal ideation.  Cheerful and talkative today          Assessment & Plan:   Problem List Items Addressed This Visit      Other   Morbid obesity ([place])    Pt is starting to make progress with wt loss  Commended on 6 lb wt loss so far  Urged to continue her plan with the health coach and keep working toward her goal  Outlined bmi guidelines and a plan for exercise Thankfully- her pickly eating is gradually improving too and she can tolerate some fruits and vegetables   Discussed how this problem influences overall health and the risks it imposes  Reviewed plan for weight loss with lower calorie diet (via better food choices and also portion control or program like weight watchers) and exercise building up to or more than 30 minutes 5 days per week including some aerobic activity    Form filled out for work      DEPRESSION/ANXIETY    Doing well with sertraline and counseling Less anxious and generally happy  Some stress issues re: health of her pets- otherwise dealing well with things No change in tx  Reviewed stressors/ coping techniques/symptoms/ support sources/ tx options and side effects in detail today        Other Visit  Diagnoses    Need for influenza vaccination    -  Primary   Relevant Orders   Flu Vaccine QUAD 36+ mos IM (Completed)

## 2016-03-17 NOTE — Assessment & Plan Note (Signed)
Doing well with sertraline and counseling Less anxious and generally happy  Some stress issues re: health of her pets- otherwise dealing well with things No change in tx  Reviewed stressors/ coping techniques/symptoms/ support sources/ tx options and side effects in detail today

## 2016-03-17 NOTE — Assessment & Plan Note (Signed)
Pt is starting to make progress with wt loss  Commended on 6 lb wt loss so far  Urged to continue her plan with the health coach and keep working toward her goal  Outlined bmi guidelines and a plan for exercise Thankfully- her pickly eating is gradually improving too and she can tolerate some fruits and vegetables   Discussed how this problem influences overall health and the risks it imposes  Reviewed plan for weight loss with lower calorie diet (via better food choices and also portion control or program like weight watchers) and exercise building up to or more than 30 minutes 5 days per week including some aerobic activity    Form filled out for work

## 2016-07-03 LAB — HM PAP SMEAR

## 2016-08-04 ENCOUNTER — Telehealth: Payer: Self-pay | Admitting: Family Medicine

## 2016-08-04 DIAGNOSIS — Z0000 Encounter for general adult medical examination without abnormal findings: Secondary | ICD-10-CM

## 2016-08-04 NOTE — Telephone Encounter (Signed)
-----   Message from Ellamae Sia sent at 08/02/2016 10:17 AM EST ----- Regarding: Lab orders for Friday, 2.16.18 Patient is scheduled for CPX labs, please order future labs, Thanks , Karna Christmas

## 2016-08-09 ENCOUNTER — Other Ambulatory Visit (INDEPENDENT_AMBULATORY_CARE_PROVIDER_SITE_OTHER): Payer: 59

## 2016-08-09 DIAGNOSIS — Z0000 Encounter for general adult medical examination without abnormal findings: Secondary | ICD-10-CM | POA: Diagnosis not present

## 2016-08-09 NOTE — Addendum Note (Signed)
Addended by: Ellamae Sia on: 08/09/2016 08:10 AM   Modules accepted: Orders

## 2016-08-14 ENCOUNTER — Encounter: Payer: Self-pay | Admitting: Family Medicine

## 2016-08-14 ENCOUNTER — Ambulatory Visit (INDEPENDENT_AMBULATORY_CARE_PROVIDER_SITE_OTHER): Payer: 59 | Admitting: Family Medicine

## 2016-08-14 VITALS — BP 128/82 | HR 68 | Temp 98.7°F | Ht 61.5 in | Wt 275.8 lb

## 2016-08-14 DIAGNOSIS — Z114 Encounter for screening for human immunodeficiency virus [HIV]: Secondary | ICD-10-CM

## 2016-08-14 DIAGNOSIS — Z0000 Encounter for general adult medical examination without abnormal findings: Secondary | ICD-10-CM

## 2016-08-14 DIAGNOSIS — R7989 Other specified abnormal findings of blood chemistry: Secondary | ICD-10-CM | POA: Insufficient documentation

## 2016-08-14 DIAGNOSIS — R946 Abnormal results of thyroid function studies: Secondary | ICD-10-CM | POA: Diagnosis not present

## 2016-08-14 DIAGNOSIS — F341 Dysthymic disorder: Secondary | ICD-10-CM | POA: Diagnosis not present

## 2016-08-14 NOTE — Progress Notes (Signed)
Pre visit review using our clinic review tool, if applicable. No additional management support is needed unless otherwise documented below in the visit note. 

## 2016-08-14 NOTE — Patient Instructions (Addendum)
Start walking now -aim to work up to 30 or more minutes per day or exercise at home   Get protein for every meal -more of it  hummus / seeds/nuts/soy/almond/dairy/beans /oatmeal/lean meats  Get your carbs from fruits and vegetables whenever possible  avocado is good too- just watch servings  Avoid simple sugar and refined carbs whenever possible (and also white potato and bread) Avoid soda and sugar beverage  Cut back on sweets and you will not crave them   Look into weight watchers   Your thyroid test is a bit off- I will repeat it with another thyroid confirmatory test and let you know   Labs today for lipid/ thyroid (repeat) and cmet and HIV screen

## 2016-08-14 NOTE — Progress Notes (Signed)
Subjective:    Patient ID: Karla Grant, female    DOB: Mar 02, 1991, 26 y.o.   MRN: [id number]  HPI Here for health maintenance exam and to review chronic medical problems    Has been feeling ok   Is down to one job 8-5 pm  Has to get out of debt Will likely file for bankruptcy  Wants to go to school for vet tech in the future   Has a new puppy - can start walking in 2 weeks     Wt Readings from Last 3 Encounters:  08/14/16 275 lb 12 oz (125.1 kg)  03/15/16 263 lb (119.3 kg)  10/19/15 256 lb (116.1 kg)  hungry all the time , does not know why  Her mood is good- not emotionally eating that she knows of  She eats too many carbs  bmi is 51.2  Picky eater Has come a long way with fruits and veg  Now eating brussel sprouts  Lazy about cooking and prepping  She has seen a nutritionist in the past   She tried to eat "clean" in the summer and she felt better     HIV screening -want to do   Self breast exam - no lumps   Pap/gyn care 1/18 - repeat pap was normal and to check iUD (kylena) No breast cancer in family  Still has period with IUD - periods are getting shorter and lighter  Less cramps and headaches    Flu vaccine 9/17 Tetanus vaccine 7/14  Depression is well controlled - taking sertraline  Stress level is ok overall  Up and down at work / issues with boss occasionally   Had labs sent to [facility] and not all of them were done Results for orders placed or performed in visit on 08/09/16  CBC with Differential/Platelet  Result Value Ref Range   WBC 8.0 3.4 - 10.8 x10E3/uL   RBC 4.71 3.77 - 5.28 x10E6/uL   Hemoglobin 13.4 11.1 - 15.9 g/dL   Hematocrit 40.8 34.0 - 46.6 %   MCV 87 79 - 97 fL   MCH 28.5 26.6 - 33.0 pg   MCHC 32.8 31.5 - 35.7 g/dL   RDW 13.3 12.3 - 15.4 %   Platelets 319 150 - 379 x10E3/uL   Neutrophils 58 Not Estab. %   Lymphs 30 Not Estab. %   Monocytes 9 Not Estab. %   Eos 2 Not Estab. %   Basos 1 Not Estab. %   Neutrophils Absolute  4.6 1.4 - 7.0 x10E3/uL   Lymphocytes Absolute 2.4 0.7 - 3.1 x10E3/uL   Monocytes Absolute 0.7 0.1 - 0.9 x10E3/uL   EOS (ABSOLUTE) 0.2 0.0 - 0.4 x10E3/uL   Basophils Absolute 0.0 0.0 - 0.2 x10E3/uL   Immature Granulocytes 0 Not Estab. %   Immature Grans (Abs) 0.0 0.0 - 0.1 x10E3/uL  Comprehensive metabolic panel  Result Value Ref Range   Glucose WILL FOLLOW    BUN WILL FOLLOW    Creatinine, Ser WILL FOLLOW    GFR calc non Af Amer WILL FOLLOW    GFR calc Af Amer WILL FOLLOW    BUN/Creatinine Ratio WILL FOLLOW    Sodium WILL FOLLOW    Potassium WILL FOLLOW    Chloride WILL FOLLOW    CO2 WILL FOLLOW    Calcium WILL FOLLOW    Total Protein WILL FOLLOW    Albumin WILL FOLLOW    Globulin, Total WILL FOLLOW    Albumin/Globulin Ratio WILL FOLLOW  Bilirubin Total WILL FOLLOW    Alkaline Phosphatase WILL FOLLOW    AST WILL FOLLOW    ALT WILL FOLLOW   Lipid panel  Result Value Ref Range   Cholesterol, Total WILL FOLLOW    Triglycerides WILL FOLLOW    HDL WILL FOLLOW    VLDL Cholesterol Cal WILL FOLLOW    LDL Calculated WILL FOLLOW    Comment: WILL FOLLOW    Chol/HDL Ratio WILL FOLLOW   TSH  Result Value Ref Range   TSH 5.180 (H) 0.450 - 4.500 uIU/mL    [facility]-need to re draw for cmet and lipids  Will re check tsh and add free T4   Patient Active Problem List   Diagnosis Date Noted  . Elevated TSH 08/14/2016  . Encounter for screening for HIV 08/14/2016  . Insomnia 12/28/2012  . TMJ (temporomandibular joint disorder) 12/02/2011  . Migraine 11/08/2011  . Routine general medical examination at a health care facility 07/21/2011  . Morbid obesity ([place]) 03/09/2010  . DEPRESSION/ANXIETY 08/16/2009  . HYPOGLYCEMIA 02/10/2009  . 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 Anxiety   . Depression   . Headache(784.0)   . Hypoglycemia   . Obesity    No past surgical history on file. Social History  Substance Use Topics  . Smoking status: Never  Smoker  . Smokeless tobacco: Never Used  . Alcohol use 0.0 oz/week     Comment: Occasional   Family History  Problem Relation Age of Onset  . Diabetes Maternal Grandmother   . Heart disease Maternal Grandfather   . Cancer Paternal Grandfather     cancer   Allergies  Allergen Reactions  . Eletriptan Hydrobromide     REACTION: chest pain  . Topiramate     REACTION: no help   Current Outpatient Prescriptions on File Prior to Visit  Medication Sig Dispense Refill  . Levonorgestrel (KYLEENA) 19.5 MG IUD by Intrauterine route.    . sertraline (ZOLOFT) 50 MG tablet Take 100 mg by mouth daily.      No current facility-administered medications on file prior to visit.     Review of Systems Review of Systems  Constitutional: Negative for fever, appetite change, fatigue and pos for unexpected weight change. (pos for change in diet)  Eyes: Negative for pain and visual disturbance.  Respiratory: Negative for cough and shortness of breath.   Cardiovascular: Negative for cp or palpitations    Gastrointestinal: Negative for nausea, diarrhea and constipation.  Genitourinary: Negative for urgency and frequency.  Skin: Negative for pallor or rash   Neurological: Negative for weakness, light-headedness, numbness and headaches.  Hematological: Negative for adenopathy. Does not bruise/bleed easily.  Psychiatric/Behavioral: Negative for dysphoric mood. The patient is not nervous/anxious.(mood is improved)         Objective:   Physical Exam  Constitutional: She appears well-developed and well-nourished. No distress.  Morbidly obese and well appearing   HENT:  Head: Normocephalic and atraumatic.  Right Ear: External ear normal.  Left Ear: External ear normal.  Mouth/Throat: Oropharynx is clear and moist.  Eyes: Conjunctivae and EOM are normal. Pupils are equal, round, and reactive to light. No scleral icterus.  Neck: Normal range of motion. Neck supple. No JVD present. Carotid bruit is not  present. No thyromegaly present.  Cardiovascular: Normal rate, regular rhythm, normal heart sounds and intact distal pulses.  Exam reveals no gallop.   Pulmonary/Chest: Effort normal and breath sounds normal. No respiratory distress.  She has no wheezes. She exhibits no tenderness.  Abdominal: Soft. Bowel sounds are normal. She exhibits no distension, no abdominal bruit and no mass. There is no tenderness.  Genitourinary: No breast swelling, tenderness, discharge or bleeding.  Genitourinary Comments: Breast exam: No mass, nodules, thickening, tenderness, bulging, retraction, inflamation, nipple discharge or skin changes noted.  No axillary or clavicular LA.      Musculoskeletal: Normal range of motion. She exhibits no edema or tenderness.  Lymphadenopathy:    She has no cervical adenopathy.  Neurological: She is alert. She has normal reflexes. No cranial nerve deficit. She exhibits normal muscle tone. Coordination normal.  Skin: Skin is warm and dry. No rash noted. No erythema. No pallor.  Fair complexion with lentigines and some benign appearing brown nevi  Psychiatric: Her mood appears not anxious. Her affect is not blunt. She does not exhibit a depressed mood.  Cheerful and talkative today          Assessment & Plan:   Problem List Items Addressed This Visit      Other   DEPRESSION/ANXIETY    Doing very well with zoloft 100 mg  Reviewed stressors/ coping techniques/symptoms/ support sources/ tx options and side effects in detail today Less anxious Motivated and outgoing  Will continue to monitor       Elevated TSH    Re check this with free T4 No symptoms except wt gain      Relevant Orders   TSH (Completed)   T4, Free (Completed)   Encounter for screening for HIV    HIV screen with labs today      Relevant Orders   HIV antibody (Completed)   Morbid obesity ([place])    Long discussion regarding this today  Discussed how this problem influences overall health and the  risks it imposes  Reviewed plan for weight loss with lower calorie diet (via better food choices and also portion control or program like weight watchers) and exercise building up to or more than 30 minutes 5 days per week including some aerobic activity    Pt has picky eating which is a big problem ? If emotional eating- always hungry / suspect this is fueled by constant intake of carbs and insulin spikes Disc getting back to clean eating (fruit/veg for carbs and lean protein with every meal) Also plan for exercise       Routine general medical examination at a health care facility - Primary    Reviewed health habits including diet and exercise and skin cancer prevention Reviewed appropriate screening tests for age  Also reviewed health mt list, fam hx and immunization status , as well as social and family history   See HPI Disc imp of wt loss and better diet in great detail  Pt desires HIV screening today  She will f/u with gyn for her routine exam and IUD check imms up to date Depression controlled Finish wellness labs today ([facility] did not run them all) Also tsh/free T4 for slt elevated tsh      Relevant Orders   Comprehensive metabolic panel (Completed)   Lipid panel (Completed)

## 2016-08-15 LAB — LIPID PANEL
CHOL/HDL RATIO: 3.9 (ref 0.0–4.4)
Cholesterol, Total: 172 mg/dL (ref 100–199)
HDL: 44 mg/dL (ref >39)
LDL Calculated: 101 — ABNORMAL HIGH (ref 0–99)
TRIGLYCERIDES: 133 mg/dL (ref 0–149)
VLDL Cholesterol Cal: 27 (ref 5–40)

## 2016-08-15 LAB — COMPREHENSIVE METABOLIC PANEL
A/G RATIO: 1.3 (ref 1.2–2.2)
ALT: 19 IU/L (ref 0–32)
AST: 16 IU/L (ref 0–40)
Albumin: 3.8 g/dL (ref 3.5–5.5)
Alkaline Phosphatase: 66 IU/L (ref 39–117)
BILIRUBIN TOTAL: 0.2 mg/dL (ref 0.0–1.2)
BUN/Creatinine Ratio: 16 (ref 9–23)
BUN: 11 mg/dL (ref 6–20)
CO2: 24 mmol/L (ref 18–29)
Calcium: 9.2 mg/dL (ref 8.7–10.2)
Chloride: 101 mmol/L (ref 96–106)
Creatinine, Ser: 0.67 mg/dL (ref 0.57–1.00)
GFR calc Af Amer: 139 (ref >59)
GFR calc non Af Amer: 121 (ref >59)
Globulin, Total: 3 (ref 1.5–4.5)
Glucose: 86 mg/dL (ref 65–99)
POTASSIUM: 3.9 mmol/L (ref 3.5–5.2)
Sodium: 140 mmol/L (ref 134–144)
Total Protein: 6.8 g/dL (ref 6.0–8.5)

## 2016-08-15 LAB — T4, FREE: FREE T4: 0.87 ng/dL (ref 0.82–1.77)

## 2016-08-15 LAB — HIV ANTIBODY (ROUTINE TESTING W REFLEX): HIV Screen 4th Generation wRfx: NONREACTIVE

## 2016-08-15 LAB — TSH: TSH: 2.66 u[IU]/mL (ref 0.450–4.500)

## 2016-08-15 NOTE — Assessment & Plan Note (Signed)
Reviewed health habits including diet and exercise and skin cancer prevention Reviewed appropriate screening tests for age  Also reviewed health mt list, fam hx and immunization status , as well as social and family history   See HPI Disc imp of wt loss and better diet in great detail  Pt desires HIV screening today  She will f/u with gyn for her routine exam and IUD check imms up to date Depression controlled Finish wellness labs today ([facility] did not run them all) Also tsh/free T4 for slt elevated tsh

## 2016-08-15 NOTE — Assessment & Plan Note (Signed)
Long discussion regarding this today  Discussed how this problem influences overall health and the risks it imposes  Reviewed plan for weight loss with lower calorie diet (via better food choices and also portion control or program like weight watchers) and exercise building up to or more than 30 minutes 5 days per week including some aerobic activity    Pt has picky eating which is a big problem ? If emotional eating- always hungry / suspect this is fueled by constant intake of carbs and insulin spikes Disc getting back to clean eating (fruit/veg for carbs and lean protein with every meal) Also plan for exercise

## 2016-08-15 NOTE — Assessment & Plan Note (Signed)
Doing very well with zoloft 100 mg  Reviewed stressors/ coping techniques/symptoms/ support sources/ tx options and side effects in detail today Less anxious Motivated and outgoing  Will continue to monitor

## 2016-08-15 NOTE — Assessment & Plan Note (Signed)
HIV screen with labs today 

## 2016-08-15 NOTE — Assessment & Plan Note (Signed)
Re check this with free T4 No symptoms except wt gain

## 2016-08-16 LAB — CBC WITH DIFFERENTIAL/PLATELET
Basophils Absolute: 0 10*3/uL (ref 0.0–0.2)
Basos: 1 %
EOS (ABSOLUTE): 0.2 10*3/uL (ref 0.0–0.4)
EOS: 2 %
Hematocrit: 40.8 % (ref 34.0–46.6)
Hemoglobin: 13.4 g/dL (ref 11.1–15.9)
IMMATURE GRANS (ABS): 0 10*3/uL (ref 0.0–0.1)
IMMATURE GRANULOCYTES: 0 %
LYMPHS: 30 %
Lymphocytes Absolute: 2.4 10*3/uL (ref 0.7–3.1)
MCH: 28.5 pg (ref 26.6–33.0)
MCHC: 32.8 g/dL (ref 31.5–35.7)
MCV: 87 fL (ref 79–97)
MONOS ABS: 0.7 10*3/uL (ref 0.1–0.9)
Monocytes: 9 %
NEUTROS PCT: 58 %
Neutrophils Absolute: 4.6 10*3/uL (ref 1.4–7.0)
PLATELETS: 319 10*3/uL (ref 150–379)
RBC: 4.71 x10E6/uL (ref 3.77–5.28)
RDW: 13.3 % (ref 12.3–15.4)
WBC: 8 10*3/uL (ref 3.4–10.8)

## 2016-08-16 LAB — COMPREHENSIVE METABOLIC PANEL

## 2016-08-16 LAB — LIPID PANEL

## 2016-08-16 LAB — TSH: TSH: 5.18 u[IU]/mL — AB (ref 0.450–4.500)

## 2016-11-29 ENCOUNTER — Encounter: Payer: Self-pay | Admitting: Family Medicine

## 2016-11-29 ENCOUNTER — Ambulatory Visit (INDEPENDENT_AMBULATORY_CARE_PROVIDER_SITE_OTHER): Payer: 59 | Admitting: Family Medicine

## 2016-11-29 VITALS — BP 132/84 | HR 89 | Temp 98.2°F | Ht 61.5 in | Wt 279.0 lb

## 2016-11-29 DIAGNOSIS — J019 Acute sinusitis, unspecified: Secondary | ICD-10-CM | POA: Diagnosis not present

## 2016-11-29 DIAGNOSIS — B9689 Other specified bacterial agents as the cause of diseases classified elsewhere: Secondary | ICD-10-CM

## 2016-11-29 MED ORDER — GUAIFENESIN-CODEINE 100-10 MG/5ML PO SYRP: 5.0000 mL | ORAL_SOLUTION | Freq: Every evening | ORAL | 0 refills | Status: DC | PRN

## 2016-11-29 MED ORDER — AMOXICILLIN-POT CLAVULANATE 875-125 MG PO TABS: 1.0000 | ORAL_TABLET | Freq: Two times a day (BID) | ORAL | 0 refills | Status: AC

## 2016-11-29 NOTE — Patient Instructions (Signed)
I think you have sinus infection - treat with augmentin antibiotic for 10 days, take with food.  Push fluids and rest.  May use ibuprofen for throat inflammation 600mg  with dinner. Salt water gargles. Suck on cold things like popsicles or warm things like herbal teas (whichever soothes the throat better). Return if fever >101.5, worsening pain, or trouble opening/closing mouth, or worsening productive cough. Good to see you today, call clinic with questions.

## 2016-11-29 NOTE — Progress Notes (Signed)
BP 132/84   Pulse 89   Temp 98.2 F (36.8 C)   Ht 5' 1.5" (1.562 m)   Wt 279 lb (126.6 kg)   SpO2 95%   BMI 51.86 kg/m    CC: ST, cough Subjective:    Patient ID: Karla Grant, female    DOB: January 24, 1991, 26 y.o.   MRN: [id number]  HPI: Karla Grant is a 26 y.o. female presenting on 11/29/2016 for Acute Visit (x 3wks cough @ night sore throat ~~ 6pm hurts to swallow )   3 wk h/o sore throat, mild PNDrainage, dry cough, sinus congestion, pressure headache. Worsening sore throat over the last week.  No fevers/chills, ear or tooth pain. No sick contacts at home. Non smoker  Saw [facility]-visit and treated with thick liquid to gargle with (had lidocaine). This didn't really help. Also treating with chloraseptic cough lozenges and cough drops, as well as other OTC analgesia.   Relevant past medical, surgical, family and social history reviewed and updated as indicated. Interim medical history since our last visit reviewed. Allergies and medications reviewed and updated. Outpatient Medications Prior to Visit  Medication Sig Dispense Refill  . Levonorgestrel (KYLEENA) 19.5 MG IUD by Intrauterine route.    . sertraline (ZOLOFT) 50 MG tablet Take 100 mg by mouth daily.      No facility-administered medications prior to visit.      Per HPI unless specifically indicated in ROS section below Review of Systems     Objective:    BP 132/84   Pulse 89   Temp 98.2 F (36.8 C)   Ht 5' 1.5" (1.562 m)   Wt 279 lb (126.6 kg)   SpO2 95%   BMI 51.86 kg/m   Wt Readings from Last 3 Encounters:  11/29/16 279 lb (126.6 kg)  08/14/16 275 lb 12 oz (125.1 kg)  03/15/16 263 lb (119.3 kg)    Physical Exam  Constitutional: She appears well-developed and well-nourished. No distress.  HENT:  Head: Normocephalic and atraumatic.  Right Ear: Hearing, tympanic membrane, external ear and ear canal normal.  Left Ear: Hearing, tympanic membrane, external ear and ear canal normal.  Nose:  Mucosal edema (nasal mucosal congestion) present. No rhinorrhea. Right sinus exhibits no maxillary sinus tenderness and no frontal sinus tenderness. Left sinus exhibits no maxillary sinus tenderness and no frontal sinus tenderness.  Mouth/Throat: Uvula is midline and mucous membranes are normal. Posterior oropharyngeal edema and posterior oropharyngeal erythema (mild) present. No oropharyngeal exudate or tonsillar abscesses.  No tonsillar exudates appreciated  Eyes: Conjunctivae and EOM are normal. Pupils are equal, round, and reactive to light. No scleral icterus.  Neck: Normal range of motion. Neck supple.  Cardiovascular: Normal rate, regular rhythm, normal heart sounds and intact distal pulses.   No murmur heard. Pulmonary/Chest: Effort normal and breath sounds normal. No respiratory distress. She has no wheezes. She has no rales.  Lymphadenopathy:    She has cervical adenopathy (R AC LAD).  Skin: Skin is warm and dry. No rash noted.  Nursing note and vitals reviewed.     Assessment & Plan:   Problem List Items Addressed This Visit    Acute bacterial sinusitis - Primary    Anticipate bacterial cause given duration and progression of symptoms. Treat with augmentin antibiotic. cheratussin for cough at night, sedation precautions reviewed. See pt instructions for further supportive care. Update if not improving with treatment.       Relevant Medications   amoxicillin-clavulanate (AUGMENTIN) 875-125  MG tablet   guaiFENesin-codeine (CHERATUSSIN AC) 100-10 MG/5ML syrup       Follow up plan: Return if symptoms worsen or fail to improve.  Ria Bush, MD

## 2016-11-29 NOTE — Assessment & Plan Note (Signed)
Anticipate bacterial cause given duration and progression of symptoms. Treat with augmentin antibiotic. cheratussin for cough at night, sedation precautions reviewed. See pt instructions for further supportive care. Update if not improving with treatment.

## 2017-03-11 ENCOUNTER — Ambulatory Visit (INDEPENDENT_AMBULATORY_CARE_PROVIDER_SITE_OTHER): Payer: 59 | Admitting: Family Medicine

## 2017-03-11 ENCOUNTER — Encounter: Payer: Self-pay | Admitting: Family Medicine

## 2017-03-11 DIAGNOSIS — R5383 Other fatigue: Secondary | ICD-10-CM | POA: Insufficient documentation

## 2017-03-11 DIAGNOSIS — R5382 Chronic fatigue, unspecified: Secondary | ICD-10-CM

## 2017-03-11 DIAGNOSIS — G4700 Insomnia, unspecified: Secondary | ICD-10-CM | POA: Diagnosis not present

## 2017-03-11 DIAGNOSIS — E739 Lactose intolerance, unspecified: Secondary | ICD-10-CM | POA: Diagnosis not present

## 2017-03-11 NOTE — Assessment & Plan Note (Signed)
Gas/ GI upset and diarrhea with some dairy products  Suggested a diet journal and trial of lactaid  products/supplements with dairy to see if it helps Disc slt protein sources Continue to follow

## 2017-03-11 NOTE — Assessment & Plan Note (Signed)
Recommend trial of benadryl 12.5 to start - 25 if needed Does not need pain reliever with it  Exercise should help Disc sleep hygiene as well

## 2017-03-11 NOTE — Assessment & Plan Note (Signed)
Discussed how this problem influences overall health and the risks it imposes  Reviewed plan for weight loss with lower calorie diet (via better food choices and also portion control or program like weight watchers) and exercise building up to or more than 30 minutes 5 days per week including some aerobic activity   Although she is trying new foods -pt still eats out far too much with a lot of sugar (for example coffee drinks) Denies emotional eating but eats for convenience She belongs to wt watchers- recommend she get back into the program  Plan for exercise-water/bike and walking when able  If no sig wt loss in 6 mo with very good compliance may be a candidate for bariatric surgery

## 2017-03-11 NOTE — Patient Instructions (Addendum)
Avoid dairy or use a lactaid supplement if you eat dairy  Supplement almond/soy products when you can   Try 12.5 to 25 mg of benadryl to sleep without the ibuprofen  (try 12.5 first)   Try to wear better shoes all the time  Athletic shoes are a good idea  (new balance are excellent)  Also try cold pack/ice on painful area to help inflammation   Recumbent or regular exercise bike would be a great form of exercise for you   (also water exercise) Walk some days/ bike some days  You need exercise and good sleep to get your energy back  Exercise helps sleep  For weight loss - follow weight watchers full time and exercise at least 5 days per week - if 6 months go by and you don't loose weight we would consider bariatric surgery

## 2017-03-11 NOTE — Progress Notes (Signed)
Subjective:    Patient ID: Karla Grant, female    DOB: Apr 03, 1991, 26 y.o.   MRN: [id number]  HPI Here for a health screenings form and several medical issues   Wt Readings from Last 3 Encounters:  03/11/17 286 lb 8 oz (130 kg)  11/29/16 279 lb (126.6 kg)  08/14/16 275 lb 12 oz (125.1 kg)   53.26 kg/m  Is a wt watchers member but does not use it  Has questions about weight loss surgery    She suspects she may be lactose intolerance  Some things bother her and some do not  Had not been tested  crampy abdomen and diarrhea some times  No vomiting  No blood in stool   Tired a lot  Sleep varies  She usually takes ibuprofen pm to help sleep (for headaches) Lab Results  Component Value Date   TSH 2.660 08/14/2016   Lab Results  Component Value Date   WBC 8.0 08/09/2016   HGB 13.4 08/09/2016   HCT 40.8 08/09/2016   MCV 87 08/09/2016   PLT 319 08/09/2016   Not exercising due to foot problems  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 Lactose intolerance 03/11/2017  . Fatigue 03/11/2017  . Elevated TSH 08/14/2016  . Encounter for screening for HIV 08/14/2016  . Insomnia 12/28/2012  . TMJ (temporomandibular joint disorder) 12/02/2011  . Migraine 11/08/2011  . Routine general medical examination at a health care facility 07/21/2011  . Morbid obesity ([place]) 03/09/2010  . DEPRESSION/ANXIETY 08/16/2009  . HYPOGLYCEMIA 02/10/2009  . 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 Anxiety   . Depression   . Headache(784.0)   . Hypoglycemia   . Obesity    No past surgical history on file. Social History  Substance Use Topics  . Smoking status: Never Smoker  . Smokeless tobacco: Never Used  . Alcohol use 0.0 oz/week     Comment: Occasional   Family History  Problem Relation Age of Onset  . Diabetes Maternal Grandmother   . Heart  disease Maternal Grandfather   . Cancer Paternal Grandfather        cancer   Allergies  Allergen Reactions  . Eletriptan Hydrobromide     REACTION: chest pain  . Topiramate     REACTION: no help   Current Outpatient Prescriptions on File Prior to Visit  Medication Sig Dispense Refill  . Levonorgestrel (KYLEENA) 19.5 MG IUD by Intrauterine route.    . sertraline (ZOLOFT) 50 MG tablet Take 100 mg by mouth daily.      No current facility-administered medications on file prior to visit.     Review of Systems  Constitutional: Negative for activity change, appetite change, fatigue, fever and unexpected weight change.  HENT: Negative for congestion, ear pain, rhinorrhea, sinus pressure and sore throat.   Eyes: Negative for pain, redness and visual disturbance.  Respiratory: Negative for cough, shortness of breath and wheezing.   Cardiovascular: Negative for chest pain and palpitations.  Gastrointestinal: Positive for diarrhea. Negative for abdominal pain, blood in stool and constipation.       Pos for GI upset with some dairy products   Endocrine: Negative for polydipsia and polyuria.  Genitourinary: Negative for dysuria, frequency and urgency.  Musculoskeletal: Negative for arthralgias, back pain and myalgias.  Skin: Negative for pallor and  rash.  Allergic/Immunologic: Negative for environmental allergies.  Neurological: Negative for dizziness, syncope and headaches.  Hematological: Negative for adenopathy. Does not bruise/bleed easily.  Psychiatric/Behavioral: Negative for decreased concentration and dysphoric mood. The patient is not nervous/anxious.        Objective:   Physical Exam  Constitutional: She appears well-developed and well-nourished. No distress.  obese and well appearing   HENT:  Head: Normocephalic and atraumatic.  Mouth/Throat: Oropharynx is clear and moist.  Eyes: Pupils are equal, round, and reactive to light. Conjunctivae and EOM are normal.  Neck: Normal  range of motion. Neck supple. No JVD present. Carotid bruit is not present. No thyromegaly present.  Cardiovascular: Normal rate, regular rhythm, normal heart sounds and intact distal pulses.  Exam reveals no gallop.   Pulmonary/Chest: Effort normal and breath sounds normal. No respiratory distress. She has no wheezes. She has no rales.  No crackles  Abdominal: Soft. Bowel sounds are normal. She exhibits no distension, no abdominal bruit and no mass. There is no tenderness.  Musculoskeletal: She exhibits no edema.  Lymphadenopathy:    She has no cervical adenopathy.  Neurological: She is alert. She has normal reflexes. She displays no tremor.  Skin: Skin is warm and dry. No rash noted. No pallor.  Psychiatric: She has a normal mood and affect.  Cheerful and talkative today           Assessment & Plan:   Problem List Items Addressed This Visit      Digestive   Lactose intolerance    Gas/ GI upset and diarrhea with some dairy products  Suggested a diet journal and trial of lactaid  products/supplements with dairy to see if it helps Disc slt protein sources Continue to follow          Other   Fatigue    Suspect due to poor sleep and lack of exercise  Lifestyle change discussed       Insomnia    Recommend trial of benadryl 12.5 to start - 25 if needed Does not need pain reliever with it  Exercise should help Disc sleep hygiene as well      Morbid obesity ([place]) - Primary    Discussed how this problem influences overall health and the risks it imposes  Reviewed plan for weight loss with lower calorie diet (via better food choices and also portion control or program like weight watchers) and exercise building up to or more than 30 minutes 5 days per week including some aerobic activity   Although she is trying new foods -pt still eats out far too much with a lot of sugar (for example coffee drinks) Denies emotional eating but eats for convenience She belongs to wt  watchers- recommend she get back into the program  Plan for exercise-water/bike and walking when able  If no sig wt loss in 6 mo with very good compliance may be a candidate for bariatric surgery

## 2017-03-11 NOTE — Assessment & Plan Note (Signed)
Suspect due to poor sleep and lack of exercise  Lifestyle change discussed

## 2017-06-27 ENCOUNTER — Encounter: Payer: Self-pay | Admitting: Family Medicine

## 2017-06-27 ENCOUNTER — Ambulatory Visit: Payer: Managed Care, Other (non HMO) | Admitting: Family Medicine

## 2017-06-27 VITALS — BP 118/80 | HR 83 | Temp 98.6°F | Wt 289.0 lb

## 2017-06-27 DIAGNOSIS — G4700 Insomnia, unspecified: Secondary | ICD-10-CM | POA: Diagnosis not present

## 2017-06-27 DIAGNOSIS — J019 Acute sinusitis, unspecified: Secondary | ICD-10-CM | POA: Diagnosis not present

## 2017-06-27 MED ORDER — AMOXICILLIN-POT CLAVULANATE 875-125 MG PO TABS: 1.0000 | ORAL_TABLET | Freq: Two times a day (BID) | ORAL | 0 refills | Status: AC

## 2017-06-27 MED ORDER — GUAIFENESIN-CODEINE 100-10 MG/5ML PO SYRP: 5.0000 mL | ORAL_SOLUTION | Freq: Two times a day (BID) | ORAL | 0 refills | Status: DC | PRN

## 2017-06-27 NOTE — Progress Notes (Signed)
BP 118/80 (BP Location: Left Arm, Patient Position: Sitting, Cuff Size: Large)   Pulse 83   Temp 98.6 F (37 C) (Oral)   Wt 289 lb (131.1 kg)   SpO2 98%   BMI 53.72 kg/m    CC: cough, pressure headache Subjective:    Patient ID: Karla Grant, female    DOB: October 19, 1990, 27 y.o.   MRN: [id number]  HPI: Karla Grant is a 27 y.o. female presenting on 06/27/2017 for Cough (Productive. Started about 1 wk ago, worsening. Tried OTC cough meds, no help.) and Sinus Problem (Pressure in head and face. Also, c/o fatigue. Started on 06/16/17.)   10+ d h/o productive cough that has progressed to head congestion, pressure, facial pain. Some post nasal drainage. Some wheezing and shortness of breath.   No fevers/chills, ear pain, ST.   Has tried tylenol and ibuprofen and pseudophed.  No sick contacts at home. Non smoker No h/o asthma.   Ongoing trouble sleeping  Relevant past medical, surgical, family and social history reviewed and updated as indicated. Interim medical history since our last visit reviewed. Allergies and medications reviewed and updated. Outpatient Medications Prior to Visit  Medication Sig Dispense Refill  . Levonorgestrel (KYLEENA) 19.5 MG IUD by Intrauterine route.    . sertraline (ZOLOFT) 50 MG tablet Take 100 mg by mouth daily.      No facility-administered medications prior to visit.      Per HPI unless specifically indicated in ROS section below Review of Systems     Objective:    BP 118/80 (BP Location: Left Arm, Patient Position: Sitting, Cuff Size: Large)   Pulse 83   Temp 98.6 F (37 C) (Oral)   Wt 289 lb (131.1 kg)   SpO2 98%   BMI 53.72 kg/m   Wt Readings from Last 3 Encounters:  06/27/17 289 lb (131.1 kg)  03/11/17 286 lb 8 oz (130 kg)  11/29/16 279 lb (126.6 kg)    Physical Exam  Constitutional: She appears well-developed and well-nourished. No distress.  HENT:  Head: Normocephalic and atraumatic.  Right Ear: Hearing, external ear and  ear canal normal.  Left Ear: Hearing, external ear and ear canal normal.  Nose: Mucosal edema present. No rhinorrhea. Right sinus exhibits maxillary sinus tenderness and frontal sinus tenderness. Left sinus exhibits maxillary sinus tenderness and frontal sinus tenderness.  Mouth/Throat: Uvula is midline, oropharynx is clear and moist and mucous membranes are normal. No oropharyngeal exudate, posterior oropharyngeal edema, posterior oropharyngeal erythema or tonsillar abscesses.  Bilateral TMs erythematous with fluid behind them Mild sinus tenderness  Eyes: Conjunctivae and EOM are normal. Pupils are equal, round, and reactive to light. No scleral icterus.  Neck: Normal range of motion. Neck supple.  Cardiovascular: Normal rate, regular rhythm, normal heart sounds and intact distal pulses.  No murmur heard. Pulmonary/Chest: Effort normal and breath sounds normal. No respiratory distress. She has no wheezes. She has no rales.  Lymphadenopathy:    She has no cervical adenopathy.  Skin: Skin is warm and dry. No rash noted.  Nursing note and vitals reviewed.     Assessment & Plan:   Problem List Items Addressed This Visit    Acute sinusitis - Primary    Anticipate bacterial given duration and progression of symptoms. Treat with augmentin antibiotic. Update if not improving with treatment. Pt agrees with plan.  cheratussin for cough at night.      Relevant Medications   amoxicillin-clavulanate (AUGMENTIN) 875-125 MG tablet   guaiFENesin-codeine (  CHERATUSSIN AC) 100-10 MG/5ML syrup   Insomnia    She has tried benadryl and melatonin.  Sleep hygiene handout provided.  F/u with PCP if not better with this.           Follow up plan: Return if symptoms worsen or fail to improve.  Ria Bush, MD

## 2017-06-27 NOTE — Patient Instructions (Addendum)
I think you have another sinus infection. Treat with augmentin antibiotic for 10 days.  Push fluids and rest, ibuprofen.  Watch for fever >101 or worsening productive cough.  cheratussin cough syrup for night time.   Sleep hygiene checklist: 1. Avoid naps during the day 2. Avoid stimulants such as caffeine and nicotine. Avoid bedtime alcohol (it can speed onset of sleep but the body's metabolism can cause awakenings). 3. All forms of exercise help ensure sound sleep - limit vigorous exercise to morning or late afternoon 4. Avoid food too close to bedtime including chocolate (which contains caffeine) 5. Soak up natural light 6. Establish regular bedtime routine. 7. Associate bed with sleep - avoid TV, computer or phone, reading while in bed. 8. Ensure pleasant, relaxing sleep environment - quiet, dark, cool room.  Sinusitis, Adult Sinusitis is soreness and inflammation of your sinuses. Sinuses are hollow spaces in the bones around your face. Your sinuses are located:  Around your eyes.  In the middle of your forehead.  Behind your nose.  In your cheekbones.  Your sinuses and nasal passages are lined with a stringy fluid (mucus). Mucus normally drains out of your sinuses. When your nasal tissues become inflamed or swollen, the mucus can become trapped or blocked so air cannot flow through your sinuses. This allows bacteria, viruses, and funguses to grow, which leads to infection. Sinusitis can develop quickly and last for 7?10 days (acute) or for more than 12 weeks (chronic). Sinusitis often develops after a cold. What are the causes? This condition is caused by anything that creates swelling in the sinuses or stops mucus from draining, including:  Allergies.  Asthma.  Bacterial or viral infection.  Abnormally shaped bones between the nasal passages.  Nasal growths that contain mucus (nasal polyps).  Narrow sinus openings.  Pollutants, such as chemicals or irritants in the  air.  A foreign object stuck in the nose.  A fungal infection. This is rare.  What increases the risk? The following factors may make you more likely to develop this condition:  Having allergies or asthma.  Having had a recent cold or respiratory tract infection.  Having structural deformities or blockages in your nose or sinuses.  Having a weak immune system.  Doing a lot of swimming or diving.  Overusing nasal sprays.  Smoking.  What are the signs or symptoms? The main symptoms of this condition are pain and a feeling of pressure around the affected sinuses. Other symptoms include:  Upper toothache.  Earache.  Headache.  Bad breath.  Decreased sense of smell and taste.  A cough that may get worse at night.  Fatigue.  Fever.  Thick drainage from your nose. The drainage is often green and it may contain pus (purulent).  Stuffy nose or congestion.  Postnasal drip. This is when extra mucus collects in the throat or back of the nose.  Swelling and warmth over the affected sinuses.  Sore throat.  Sensitivity to light.  How is this diagnosed? This condition is diagnosed based on symptoms, a medical history, and a physical exam. To find out if your condition is acute or chronic, your health care provider may:  Look in your nose for signs of nasal polyps.  Tap over the affected sinus to check for signs of infection.  View the inside of your sinuses using an imaging device that has a light attached (endoscope).  If your health care provider suspects that you have chronic sinusitis, you may also:  Be tested  for allergies.  Have a sample of mucus taken from your nose (nasal culture) and checked for bacteria.  Have a mucus sample examined to see if your sinusitis is related to an allergy.  If your sinusitis does not respond to treatment and it lasts longer than 8 weeks, you may have an MRI or CT scan to check your sinuses. These scans also help to determine  how severe your infection is. In rare cases, a bone biopsy may be done to rule out more serious types of fungal sinus disease. How is this treated? Treatment for sinusitis depends on the cause and whether your condition is chronic or acute. If a virus is causing your sinusitis, your symptoms will go away on their own within 10 days. You may be given medicines to relieve your symptoms, including:  Topical nasal decongestants. They shrink swollen nasal passages and let mucus drain from your sinuses.  Antihistamines. These drugs block inflammation that is triggered by allergies. This can help to ease swelling in your nose and sinuses.  Topical nasal corticosteroids. These are nasal sprays that ease inflammation and swelling in your nose and sinuses.  Nasal saline washes. These rinses can help to get rid of thick mucus in your nose.  If your condition is caused by bacteria, you will be given an antibiotic medicine. If your condition is caused by a fungus, you will be given an antifungal medicine. Surgery may be needed to correct underlying conditions, such as narrow nasal passages. Surgery may also be needed to remove polyps. Follow these instructions at home: Medicines  Take, use, or apply over-the-counter and prescription medicines only as told by your health care provider. These may include nasal sprays.  If you were prescribed an antibiotic medicine, take it as told by your health care provider. Do not stop taking the antibiotic even if you start to feel better. Hydrate and Humidify  Drink enough water to keep your urine clear or pale yellow. Staying hydrated will help to thin your mucus.  Use a cool mist humidifier to keep the humidity level in your home above 50%.  Inhale steam for 10-15 minutes, 3-4 times a day or as told by your health care provider. You can do this in the bathroom while a hot shower is running.  Limit your exposure to cool or dry air. Rest  Rest as much as  possible.  Sleep with your head raised (elevated).  Make sure to get enough sleep each night. General instructions  Apply a warm, moist washcloth to your face 3-4 times a day or as told by your health care provider. This will help with discomfort.  Wash your hands often with soap and water to reduce your exposure to viruses and other germs. If soap and water are not available, use hand sanitizer.  Do not smoke. Avoid being around people who are smoking (secondhand smoke).  Keep all follow-up visits as told by your health care provider. This is important. Contact a health care provider if:  You have a fever.  Your symptoms get worse.  Your symptoms do not improve within 10 days. Get help right away if:  You have a severe headache.  You have persistent vomiting.  You have pain or swelling around your face or eyes.  You have vision problems.  You develop confusion.  Your neck is stiff.  You have trouble breathing. This information is not intended to replace advice given to you by your health care provider. Make sure you discuss  any questions you have with your health care provider. Document Released: 06/10/2005 Document Revised: 02/04/2016 Document Reviewed: 04/05/2015 Elsevier Interactive Patient Education  [employer].

## 2017-06-27 NOTE — Assessment & Plan Note (Signed)
She has tried benadryl and melatonin.  Sleep hygiene handout provided.  F/u with PCP if not better with this.

## 2017-06-27 NOTE — Assessment & Plan Note (Addendum)
Anticipate bacterial given duration and progression of symptoms. Treat with augmentin antibiotic. Update if not improving with treatment. Pt agrees with plan.  cheratussin for cough at night.

## 2017-08-10 ENCOUNTER — Telehealth: Payer: Self-pay | Admitting: Family Medicine

## 2017-08-10 DIAGNOSIS — R7989 Other specified abnormal findings of blood chemistry: Secondary | ICD-10-CM

## 2017-08-10 DIAGNOSIS — Z0000 Encounter for general adult medical examination without abnormal findings: Secondary | ICD-10-CM

## 2017-08-10 NOTE — Telephone Encounter (Signed)
-----   Message from Ellamae Sia sent at 08/05/2017  3:04 PM EST ----- Regarding: Lab orders for Tuesday, 2.19.19 Patient is scheduled for CPX labs, please order future labs, Thanks , Karna Christmas

## 2017-08-12 ENCOUNTER — Other Ambulatory Visit (INDEPENDENT_AMBULATORY_CARE_PROVIDER_SITE_OTHER): Payer: Managed Care, Other (non HMO)

## 2017-08-12 DIAGNOSIS — Z0000 Encounter for general adult medical examination without abnormal findings: Secondary | ICD-10-CM | POA: Diagnosis not present

## 2017-08-12 DIAGNOSIS — R7989 Other specified abnormal findings of blood chemistry: Secondary | ICD-10-CM

## 2017-08-12 NOTE — Addendum Note (Signed)
Addended by: Ellamae Sia on: 08/12/2017 07:56 AM   Modules accepted: Orders

## 2017-08-14 LAB — COMPREHENSIVE METABOLIC PANEL
A/G RATIO: 1.1 — AB (ref 1.2–2.2)
ALK PHOS: 70 IU/L (ref 39–117)
ALT: 25 IU/L (ref 0–32)
AST: 19 IU/L (ref 0–40)
Albumin: 3.7 g/dL (ref 3.5–5.5)
BILIRUBIN TOTAL: 0.3 mg/dL (ref 0.0–1.2)
BUN/Creatinine Ratio: 17 (ref 9–23)
BUN: 11 mg/dL (ref 6–20)
CHLORIDE: 105 mmol/L (ref 96–106)
CO2: 21 mmol/L (ref 20–29)
Calcium: 8.9 mg/dL (ref 8.7–10.2)
Creatinine, Ser: 0.65 mg/dL (ref 0.57–1.00)
GFR calc non Af Amer: 121 mL/min/{1.73_m2} (ref >59)
GFR, EST AFRICAN AMERICAN: 140 mL/min/{1.73_m2} (ref >59)
GLUCOSE: 110 mg/dL — AB (ref 65–99)
Globulin, Total: 3.4 g/dL (ref 1.5–4.5)
POTASSIUM: 4.4 mmol/L (ref 3.5–5.2)
Sodium: 142 mmol/L (ref 134–144)
Total Protein: 7.1 g/dL (ref 6.0–8.5)

## 2017-08-14 LAB — TSH: TSH: 2.52 u[IU]/mL (ref 0.450–4.500)

## 2017-08-14 LAB — CBC WITH DIFFERENTIAL/PLATELET
BASOS ABS: 0 10*3/uL (ref 0.0–0.2)
BASOS: 1 %
EOS (ABSOLUTE): 0.2 10*3/uL (ref 0.0–0.4)
Eos: 3 %
Hematocrit: 44 % (ref 34.0–46.6)
Hemoglobin: 14.7 g/dL (ref 11.1–15.9)
IMMATURE GRANS (ABS): 0 10*3/uL (ref 0.0–0.1)
Immature Granulocytes: 0 %
LYMPHS ABS: 2.5 10*3/uL (ref 0.7–3.1)
LYMPHS: 36 %
MCH: 29.2 pg (ref 26.6–33.0)
MCHC: 33.4 g/dL (ref 31.5–35.7)
MCV: 88 fL (ref 79–97)
Monocytes Absolute: 0.7 10*3/uL (ref 0.1–0.9)
Monocytes: 9 %
NEUTROS ABS: 3.5 10*3/uL (ref 1.4–7.0)
Neutrophils: 51 %
PLATELETS: 365 10*3/uL (ref 150–379)
RBC: 5.03 x10E6/uL (ref 3.77–5.28)
RDW: 13.5 % (ref 12.3–15.4)
WBC: 7 10*3/uL (ref 3.4–10.8)

## 2017-08-14 LAB — LIPID PANEL
CHOLESTEROL TOTAL: 183 mg/dL (ref 100–199)
Chol/HDL Ratio: 4.2 ratio (ref 0.0–4.4)
HDL: 44 mg/dL (ref >39)
LDL Calculated: 120 mg/dL — ABNORMAL HIGH (ref 0–99)
Triglycerides: 97 mg/dL (ref 0–149)
VLDL CHOLESTEROL CAL: 19 mg/dL (ref 5–40)

## 2017-08-14 LAB — T4, FREE: FREE T4: 0.94 ng/dL (ref 0.82–1.77)

## 2017-08-15 ENCOUNTER — Ambulatory Visit (INDEPENDENT_AMBULATORY_CARE_PROVIDER_SITE_OTHER): Payer: Managed Care, Other (non HMO) | Admitting: Family Medicine

## 2017-08-15 ENCOUNTER — Encounter: Payer: Self-pay | Admitting: Family Medicine

## 2017-08-15 VITALS — BP 106/76 | HR 76 | Temp 98.2°F | Ht 61.25 in | Wt 288.8 lb

## 2017-08-15 DIAGNOSIS — Z0000 Encounter for general adult medical examination without abnormal findings: Secondary | ICD-10-CM | POA: Diagnosis not present

## 2017-08-15 DIAGNOSIS — F341 Dysthymic disorder: Secondary | ICD-10-CM | POA: Diagnosis not present

## 2017-08-15 DIAGNOSIS — Z23 Encounter for immunization: Secondary | ICD-10-CM | POA: Diagnosis not present

## 2017-08-15 DIAGNOSIS — R7309 Other abnormal glucose: Secondary | ICD-10-CM

## 2017-08-15 NOTE — Assessment & Plan Note (Signed)
Fasting glucose is 110  Morbidly obese and planning bariatric surgery  This should help greatly  disc imp of low glycemic diet and wt loss to prevent DM2

## 2017-08-15 NOTE — Patient Instructions (Addendum)
For cholesterol  Avoid red meat/ fried foods/ egg yolks/ fatty breakfast meats/ butter, cheese and high fat dairy/ and shellfish    For blood sugar  Try to get most of your carbohydrates from produce (with the exception of white potatoes)  Eat less bread/pasta/rice/snack foods/cereals/sweets and other items from the middle of the grocery store (processed carbs)   Take care of yourself  Join the gym  Good luck with the bariatric surgery process

## 2017-08-15 NOTE — Assessment & Plan Note (Addendum)
Doing quite well with zoloft Reviewed stressors/ coping techniques/symptoms/ support sources/ tx options and side effects in detail today Handling work stress better Planning to join gym and also planning bariatric sugery  PHQ-9 reviewed today

## 2017-08-15 NOTE — Progress Notes (Signed)
Subjective:    Patient ID: Karla Grant, female    DOB: March 31, 1991, 27 y.o.   MRN: [id number]  HPI Here for health maintenance exam and to review chronic medical problems    Still working just one job  Out of debt finally - after [employer] Readings from Last 3 Encounters:  08/15/17 288 lb 12 oz (131 kg)  06/27/17 289 lb (131.1 kg)  03/11/17 286 lb 8 oz (130 kg)   54.11 kg/m -gained more wt at this time   Has seen Dr Valetta Close at [facility] for bariatric clinic =planning bariatric surgery  Had surgical visit for gastric bypass -Dr Volanda Napoleon Will have sleep study and endoscopy  Working with nrg navigator and with psychol and nutritionist  Lost 0.2 lb  Has not been able to loose wt  Hopes to have surgery by may  Looking into gym membership- [employer] in [place]  They have water classes there every night   Did give up sodas and junk food  Trying to eat better   Gyn care  IUD -had to have it replaced after it moved / has a new one now  Replaced with [place]  Sees gyn regularly  Has occ spotting but no menses  Had pap 1/18  Flu vaccine-will get today  Tetanus vaccine 7/14   Mood - is fairly good  Stressor is work  Very happy to be debt free -and saving money- very motivated to buy a house   BP Readings from Last 3 Encounters:  08/15/17 106/76  06/27/17 118/80  03/11/17 124/68   Pulse Readings from Last 3 Encounters:  08/15/17 76  06/27/17 83  03/11/17 68   Labs  Results for orders placed or performed in visit on 08/12/17  CBC with Differential/Platelet  Result Value Ref Range   WBC 7.0 3.4 - 10.8 x10E3/uL   RBC 5.03 3.77 - 5.28 x10E6/uL   Hemoglobin 14.7 11.1 - 15.9 g/dL   Hematocrit 44.0 34.0 - 46.6 %   MCV 88 79 - 97 fL   MCH 29.2 26.6 - 33.0 pg   MCHC 33.4 31.5 - 35.7 g/dL   RDW 13.5 12.3 - 15.4 %   Platelets 365 150 - 379 x10E3/uL   Neutrophils 51 Not Estab. %   Lymphs 36 Not Estab. %   Monocytes 9 Not Estab. %   Eos 3 Not Estab. %   Basos 1 Not  Estab. %   Neutrophils Absolute 3.5 1.4 - 7.0 x10E3/uL   Lymphocytes Absolute 2.5 0.7 - 3.1 x10E3/uL   Monocytes Absolute 0.7 0.1 - 0.9 x10E3/uL   EOS (ABSOLUTE) 0.2 0.0 - 0.4 x10E3/uL   Basophils Absolute 0.0 0.0 - 0.2 x10E3/uL   Immature Granulocytes 0 Not Estab. %   Immature Grans (Abs) 0.0 0.0 - 0.1 x10E3/uL  Comprehensive metabolic panel  Result Value Ref Range   Glucose 110 (H) 65 - 99 mg/dL   BUN 11 6 - 20 mg/dL   Creatinine, Ser 0.65 0.57 - 1.00 mg/dL   GFR calc non Af Amer 121 >59 mL/min/1.73   GFR calc Af Amer 140 >59 mL/min/1.73   BUN/Creatinine Ratio 17 9 - 23   Sodium 142 134 - 144 mmol/L   Potassium 4.4 3.5 - 5.2 mmol/L   Chloride 105 96 - 106 mmol/L   CO2 21 20 - 29 mmol/L   Calcium 8.9 8.7 - 10.2 mg/dL   Total Protein 7.1 6.0 - 8.5 g/dL   Albumin 3.7 3.5 -  5.5 g/dL   Globulin, Total 3.4 1.5 - 4.5 g/dL   Albumin/Globulin Ratio 1.1 (L) 1.2 - 2.2   Bilirubin Total 0.3 0.0 - 1.2 mg/dL   Alkaline Phosphatase 70 39 - 117 IU/L   AST 19 0 - 40 IU/L   ALT 25 0 - 32 IU/L  Lipid panel  Result Value Ref Range   Cholesterol, Total 183 100 - 199 mg/dL   Triglycerides 97 0 - 149 mg/dL   HDL 44 >39 mg/dL   VLDL Cholesterol Cal 19 5 - 40 mg/dL   LDL Calculated 120 (H) 0 - 99 mg/dL   Chol/HDL Ratio 4.2 0.0 - 4.4 ratio  TSH  Result Value Ref Range   TSH 2.520 0.450 - 4.500 uIU/mL  T4, free  Result Value Ref Range   Free T4 0.94 0.82 - 1.77 ng/dL    Borderline for cholesterol LDL is up    Patient Active Problem List   Diagnosis Date Noted  . Lactose intolerance 03/11/2017  . Fatigue 03/11/2017  . Encounter for screening for HIV 08/14/2016  . Insomnia 12/28/2012  . TMJ (temporomandibular joint disorder) 12/02/2011  . Migraine 11/08/2011  . Routine general medical examination at a health care facility 07/21/2011  . Morbid obesity ([place]) 03/09/2010  . DEPRESSION/ANXIETY 08/16/2009  . HYPOGLYCEMIA 02/10/2009  . 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 Anxiety   . Depression   . Headache(784.0)   . Hypoglycemia   . Obesity    History reviewed. No pertinent surgical history. Social History   Tobacco Use  . Smoking status: Never Smoker  . Smokeless tobacco: Never Used  Substance Use Topics  . Alcohol use: Yes    Alcohol/week: 0.0 oz    Comment: Occasional  . Drug use: No   Family History  Problem Relation Age of Onset  . Diabetes Maternal Grandmother   . Heart disease Maternal Grandfather   . Cancer Paternal Grandfather        cancer   Allergies  Allergen Reactions  . Eletriptan Hydrobromide     REACTION: chest pain  . Topiramate     REACTION: no help   Current Outpatient Medications on File Prior to Visit  Medication Sig Dispense Refill  . Levonorgestrel (KYLEENA) 19.5 MG IUD by Intrauterine route.    . sertraline (ZOLOFT) 50 MG tablet Take 100 mg by mouth daily.      No current facility-administered medications on file prior to visit.     Review of Systems  Constitutional: Positive for fatigue. Negative for activity change, appetite change, fever and unexpected weight change.       Pos for poor conditioning   HENT: Negative for congestion, ear pain, rhinorrhea, sinus pressure and sore throat.   Eyes: Negative for pain, redness and visual disturbance.  Respiratory: Negative for cough, shortness of breath and wheezing.   Cardiovascular: Negative for chest pain and palpitations.  Gastrointestinal: Negative for abdominal pain, blood in stool, constipation and diarrhea.  Endocrine: Negative for polydipsia and polyuria.  Genitourinary: Negative for dysuria, frequency and urgency.  Musculoskeletal: Negative for arthralgias, back pain and myalgias.  Skin: Negative for pallor and rash.  Allergic/Immunologic: Negative for environmental allergies.  Neurological: Positive for headaches. Negative for dizziness and syncope.  Hematological: Negative for adenopathy. Does not bruise/bleed easily.   Psychiatric/Behavioral: Negative for decreased concentration and dysphoric mood. The patient is not nervous/anxious.        Mood is improved  Objective:   Physical Exam  Constitutional: She appears well-developed and well-nourished. No distress.  Morbidly obese and well appearing   HENT:  Head: Normocephalic and atraumatic.  Right Ear: External ear normal.  Left Ear: External ear normal.  Mouth/Throat: Oropharynx is clear and moist.  Eyes: Conjunctivae and EOM are normal. Pupils are equal, round, and reactive to light. No scleral icterus.  Neck: Normal range of motion. Neck supple. No JVD present. Carotid bruit is not present. No thyromegaly present.  Cardiovascular: Normal rate, regular rhythm, normal heart sounds and intact distal pulses. Exam reveals no gallop.  Pulmonary/Chest: Effort normal and breath sounds normal. No respiratory distress. She has no wheezes. She exhibits no tenderness.  Abdominal: Soft. Bowel sounds are normal. She exhibits no distension, no abdominal bruit and no mass. There is no tenderness.  Genitourinary: No breast swelling, tenderness, discharge or bleeding.  Genitourinary Comments: Breast exam: No mass, nodules, thickening, tenderness, bulging, retraction, inflamation, nipple discharge or skin changes noted.  No axillary or clavicular LA.      Musculoskeletal: Normal range of motion. She exhibits no edema or tenderness.  Lymphadenopathy:    She has no cervical adenopathy.  Neurological: She is alert. She has normal reflexes. No cranial nerve deficit. She exhibits normal muscle tone. Coordination normal.  Skin: Skin is warm and dry. No rash noted. No erythema. No pallor.  Solar lentigines diffusely   Psychiatric: She has a normal mood and affect.  Cheerful and talkative           Assessment & Plan:   Problem List Items Addressed This Visit      Other   DEPRESSION/ANXIETY - Primary    Doing quite well with zoloft Reviewed stressors/ coping  techniques/symptoms/ support sources/ tx options and side effects in detail today Handling work stress better Planning to join gym and also planning bariatric sugery  PHQ-9 reviewed today       Elevated random blood glucose level    Fasting glucose is 110  Morbidly obese and planning bariatric surgery  This should help greatly  disc imp of low glycemic diet and wt loss to prevent DM2        Morbid obesity ([place])    Discussed how this problem influences overall health and the risks it imposes  Reviewed plan for weight loss with lower calorie diet (via better food choices and also portion control or program like weight watchers) and exercise building up to or more than 30 minutes 5 days per week including some aerobic activity   Planning bariatric surgery with [facility]  Has sleep study/egd and psych eval planned  Disc expectations Very motivated       Routine general medical examination at a health care facility    Reviewed health habits including diet and exercise and skin cancer prevention Reviewed appropriate screening tests for age  Also reviewed health mt list, fam hx and immunization status , as well as social and family history   See HPI Labs rev  Chol up -disc diet change for that  Planning bariatric surgery  Disc exercise plan  Flu shot today Nl breast exam  Continue f/u with gyn for paps and iud         Other Visit Diagnoses    Need for influenza vaccination       Relevant Orders   Flu Vaccine QUAD 6+ mos PF IM (Fluarix Quad PF) (Completed)

## 2017-08-15 NOTE — Assessment & Plan Note (Signed)
Discussed how this problem influences overall health and the risks it imposes  Reviewed plan for weight loss with lower calorie diet (via better food choices and also portion control or program like weight watchers) and exercise building up to or more than 30 minutes 5 days per week including some aerobic activity   Planning bariatric surgery with [facility]  Has sleep study/egd and psych eval planned  Disc expectations Very motivated

## 2017-08-15 NOTE — Assessment & Plan Note (Signed)
Reviewed health habits including diet and exercise and skin cancer prevention Reviewed appropriate screening tests for age  Also reviewed health mt list, fam hx and immunization status , as well as social and family history   See HPI Labs rev  Chol up -disc diet change for that  Planning bariatric surgery  Disc exercise plan  Flu shot today Nl breast exam  Continue f/u with gyn for paps and iud

## 2018-03-09 ENCOUNTER — Encounter: Payer: Self-pay | Admitting: Family Medicine

## 2018-03-09 ENCOUNTER — Ambulatory Visit: Payer: Managed Care, Other (non HMO) | Admitting: Family Medicine

## 2018-03-09 DIAGNOSIS — Z23 Encounter for immunization: Secondary | ICD-10-CM | POA: Diagnosis not present

## 2018-03-09 NOTE — Patient Instructions (Signed)
Keep up the good work with diet and exercise to keep loosing weight   Flu shot today   Form filled out for work

## 2018-03-09 NOTE — Progress Notes (Signed)
Subjective:    Patient ID: Karla Grant, female    DOB: 02-04-1991, 27 y.o.   MRN: [id number]  HPI Here for complete health screening form for work  [employer] Readings from Last 3 Encounters:  03/09/18 273 lb 4 oz (123.9 kg)  08/15/17 288 lb 12 oz (131 kg)  06/27/17 289 lb (131.1 kg)  cutting out processed foods and not eating out  Also cut back on sodas  Also cut sugar from coffee  50.79 kg/m   Seeing a bariatric surgeon to plan for surgery  She is not financially able to plan the surgical date yet   Is looking for a new job   Lost 15lb since last visit   Getting over some of her food intolerances  Ate some asparagus  Guacamole as well  Using ground [place]   Needs flu shot today   Thinks are going well- moved out of her family house  Really likes it  Less stress  Has a gym at her apt complex  More walking 1=2 times per day - with dogs  Lot of steps   BP Readings from Last 3 Encounters:  03/09/18 104/66  08/15/17 106/76  06/27/17 118/80   Patient Active Problem List   Diagnosis Date Noted  . Lactose intolerance 03/11/2017  . Insomnia 12/28/2012  . TMJ (temporomandibular joint disorder) 12/02/2011  . Migraine 11/08/2011  . Routine general medical examination at a health care facility 07/21/2011  . Morbid obesity ([place]) 03/09/2010  . DEPRESSION/ANXIETY 08/16/2009  . Elevated random blood glucose level 02/10/2009  . 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 Anxiety   . Depression   . Headache(784.0)   . Hypoglycemia   . Obesity    No past surgical history on file. Social History   Tobacco Use  . Smoking status: Never Smoker  . Smokeless tobacco: Never Used  Substance Use Topics  . Alcohol use: Yes    Alcohol/week: 0.0 standard drinks    Comment: Occasional  . Drug use: No   Family History  Problem Relation Age of Onset  . Diabetes Maternal Grandmother   . Heart disease Maternal Grandfather   . Cancer Paternal Grandfather         cancer   Allergies  Allergen Reactions  . Eletriptan Hydrobromide     REACTION: chest pain  . Topiramate     REACTION: no help   Current Outpatient Medications on File Prior to Visit  Medication Sig Dispense Refill  . FLUoxetine (PROZAC) 40 MG capsule Take by mouth daily.    . Levonorgestrel (KYLEENA) 19.5 MG IUD by Intrauterine route.    . traZODone (DESYREL) 50 MG tablet Take 1 tablet by mouth at bedtime as needed.     No current facility-administered medications on file prior to visit.      Review of Systems  Constitutional: Negative for activity change, appetite change, fatigue and unexpected weight change.  Eyes: Negative for photophobia and visual disturbance.  Respiratory: Negative for cough and shortness of breath.        Cpap for osa  Cardiovascular: Negative for leg swelling.  Gastrointestinal: Negative for abdominal distention, abdominal pain, constipation and diarrhea.  Endocrine: Negative for cold intolerance, heat intolerance, polydipsia, polyphagia and polyuria.  Genitourinary: Negative for frequency.  Musculoskeletal: Negative for arthralgias, back pain, gait problem and joint swelling.  Neurological: Negative for light-headedness, numbness and headaches.  Hematological: Negative for adenopathy.  Psychiatric/Behavioral: Negative for decreased concentration and  dysphoric mood. The patient is not nervous/anxious.        Mood is good        Objective:   Physical Exam  Constitutional: She appears well-developed and well-nourished. No distress.  Morbidly obese and well appearing   HENT:  Head: Normocephalic and atraumatic.  Mouth/Throat: Oropharynx is clear and moist.  Eyes: Pupils are equal, round, and reactive to light. Conjunctivae and EOM are normal.  Neck: Normal range of motion. Neck supple. No JVD present. Carotid bruit is not present. No thyromegaly present.  Cardiovascular: Normal rate, regular rhythm, normal heart sounds and intact distal  pulses. Exam reveals no gallop.  Pulmonary/Chest: Effort normal and breath sounds normal. No respiratory distress. She has no wheezes. She has no rales.  No crackles  Abdominal: She exhibits no abdominal bruit.  Musculoskeletal: She exhibits no edema.  Lymphadenopathy:    She has no cervical adenopathy.  Neurological: She is alert. She has normal reflexes. She displays normal reflexes. No cranial nerve deficit. Coordination normal.  Skin: Skin is warm and dry. No rash noted.  Psychiatric: She has a normal mood and affect.  Pleasant  Good mood          Assessment & Plan:   Problem List Items Addressed This Visit      Other   Morbid obesity ([place]) - Primary    15 lb lost so far with reasonable diet and exercise  She may consider bariatric surgery later- is under care of a [facility] for that  Living by herself has helped  Less food intolerances and also does not have junk food in the house Form filled out for work - stating 15 lb lost/ would aim for 2-4 lb per month /reasonable  (for BMI exclusion for her ins)        Other Visit Diagnoses    Need for influenza vaccination       Relevant Orders   Flu Vaccine QUAD 6+ mos PF IM (Fluarix Quad PF) (Completed)

## 2018-03-09 NOTE — Assessment & Plan Note (Signed)
15 lb lost so far with reasonable diet and exercise  She may consider bariatric surgery later- is under care of a [facility] practice for that  Living by herself has helped  Less food intolerances and also does not have junk food in the house Form filled out for work - stating 15 lb lost/ would aim for 2-4 lb per month /reasonable  (for BMI exclusion for her ins)

## 2018-05-06 LAB — HM PAP SMEAR: HM Pap smear: NEGATIVE

## 2018-08-16 ENCOUNTER — Telehealth: Payer: Self-pay | Admitting: Family Medicine

## 2018-08-16 DIAGNOSIS — Z0000 Encounter for general adult medical examination without abnormal findings: Secondary | ICD-10-CM

## 2018-08-16 DIAGNOSIS — R7309 Other abnormal glucose: Secondary | ICD-10-CM

## 2018-08-16 NOTE — Telephone Encounter (Signed)
-----   Message from Ellamae Sia sent at 08/11/2018  2:59 PM EST ----- Regarding: Lab orders for Tuesday,2.25.20 Patient is scheduled for CPX labs, please order future labs, Thanks , Karna Christmas

## 2018-08-18 ENCOUNTER — Other Ambulatory Visit (INDEPENDENT_AMBULATORY_CARE_PROVIDER_SITE_OTHER): Payer: Managed Care, Other (non HMO)

## 2018-08-18 DIAGNOSIS — Z0000 Encounter for general adult medical examination without abnormal findings: Secondary | ICD-10-CM | POA: Diagnosis not present

## 2018-08-18 DIAGNOSIS — R7309 Other abnormal glucose: Secondary | ICD-10-CM

## 2018-08-18 DIAGNOSIS — R739 Hyperglycemia, unspecified: Secondary | ICD-10-CM

## 2018-08-18 NOTE — Addendum Note (Signed)
Addended by: Ellamae Sia on: 08/18/2018 08:00 AM   Modules accepted: Orders

## 2018-08-19 LAB — CBC WITH DIFFERENTIAL/PLATELET
Basophils Absolute: 0.1 10*3/uL (ref 0.0–0.2)
Basos: 1 %
EOS (ABSOLUTE): 0.2 10*3/uL (ref 0.0–0.4)
Eos: 2 %
Hematocrit: 43.4 % (ref 34.0–46.6)
Hemoglobin: 14.2 g/dL (ref 11.1–15.9)
Immature Grans (Abs): 0 10*3/uL (ref 0.0–0.1)
Immature Granulocytes: 0 %
Lymphocytes Absolute: 1.9 10*3/uL (ref 0.7–3.1)
Lymphs: 24 %
MCH: 29.8 pg (ref 26.6–33.0)
MCHC: 32.7 g/dL (ref 31.5–35.7)
MCV: 91 fL (ref 79–97)
Monocytes Absolute: 0.6 10*3/uL (ref 0.1–0.9)
Monocytes: 8 %
Neutrophils Absolute: 5.3 10*3/uL (ref 1.4–7.0)
Neutrophils: 65 %
PLATELETS: 314 10*3/uL (ref 150–450)
RBC: 4.77 x10E6/uL (ref 3.77–5.28)
RDW: 12.4 % (ref 11.7–15.4)
WBC: 8.1 10*3/uL (ref 3.4–10.8)

## 2018-08-19 LAB — LIPID PANEL
Chol/HDL Ratio: 4.1 ratio (ref 0.0–4.4)
Cholesterol, Total: 161 mg/dL (ref 100–199)
HDL: 39 mg/dL — AB (ref >39)
LDL Calculated: 95 mg/dL (ref 0–99)
Triglycerides: 136 mg/dL (ref 0–149)
VLDL Cholesterol Cal: 27 mg/dL (ref 5–40)

## 2018-08-19 LAB — COMPREHENSIVE METABOLIC PANEL
ALK PHOS: 59 IU/L (ref 39–117)
ALT: 21 IU/L (ref 0–32)
AST: 17 IU/L (ref 0–40)
Albumin/Globulin Ratio: 1.3 (ref 1.2–2.2)
Albumin: 2.9 g/dL — ABNORMAL LOW (ref 3.9–5.0)
BILIRUBIN TOTAL: 0.5 mg/dL (ref 0.0–1.2)
BUN/Creatinine Ratio: 16 (ref 9–23)
BUN: 11 mg/dL (ref 6–20)
CO2: 25 mmol/L (ref 20–29)
Calcium: 8.3 mg/dL — ABNORMAL LOW (ref 8.7–10.2)
Chloride: 106 mmol/L (ref 96–106)
Creatinine, Ser: 0.68 mg/dL (ref 0.57–1.00)
GFR calc Af Amer: 137 mL/min/{1.73_m2} (ref >59)
GFR calc non Af Amer: 119 mL/min/{1.73_m2} (ref >59)
Globulin, Total: 2.2 g/dL (ref 1.5–4.5)
Glucose: 107 mg/dL — ABNORMAL HIGH (ref 65–99)
Potassium: 4.2 mmol/L (ref 3.5–5.2)
SODIUM: 141 mmol/L (ref 134–144)
Total Protein: 5.1 g/dL — ABNORMAL LOW (ref 6.0–8.5)

## 2018-08-19 LAB — HEMOGLOBIN A1C
Est. average glucose Bld gHb Est-mCnc: 94 mg/dL
Hgb A1c MFr Bld: 4.9 % (ref 4.8–5.6)

## 2018-08-19 LAB — TSH: TSH: 1.89 u[IU]/mL (ref 0.450–4.500)

## 2018-08-21 ENCOUNTER — Encounter: Payer: Self-pay | Admitting: Family Medicine

## 2018-08-21 ENCOUNTER — Ambulatory Visit (INDEPENDENT_AMBULATORY_CARE_PROVIDER_SITE_OTHER): Payer: Managed Care, Other (non HMO) | Admitting: Family Medicine

## 2018-08-21 VITALS — BP 116/72 | HR 89 | Temp 98.3°F | Ht 61.5 in | Wt 269.3 lb

## 2018-08-21 DIAGNOSIS — R7309 Other abnormal glucose: Secondary | ICD-10-CM

## 2018-08-21 DIAGNOSIS — R739 Hyperglycemia, unspecified: Secondary | ICD-10-CM

## 2018-08-21 DIAGNOSIS — F341 Dysthymic disorder: Secondary | ICD-10-CM

## 2018-08-21 DIAGNOSIS — Z0000 Encounter for general adult medical examination without abnormal findings: Secondary | ICD-10-CM

## 2018-08-21 NOTE — Patient Instructions (Addendum)
To raise your good cholesterol - exercise  Bad cholesterol did improve   Glucose is good Try to get most of your carbohydrates from produce (with the exception of white potatoes)  Eat less bread/pasta/rice/snack foods/cereals/sweets and other items from the middle of the grocery store (processed carbs)   Keep working on loosing weight  Keep up good fluid intake  Increase exercise as a goal

## 2018-08-21 NOTE — Progress Notes (Signed)
Subjective:    Patient ID: Karla Grant, female    DOB: 24-Apr-1991, 28 y.o.   MRN: [id number]  HPI Here for health maintenance exam and to review chronic medical problems   Still works within [employer] to re locate offices - will keep applying  Job is going better than it was overall   Going to [place] end of march    Wt Readings from Last 3 Encounters:  08/21/18 269 lb 5 oz (122.2 kg)  03/09/18 273 lb 4 oz (123.9 kg)  08/15/17 288 lb 12 oz (131 kg)  taking care of herself and loosing weight   50.06 kg/m   Still putting off her bariatric surgery (is loosing weight without it) Getting in more water  Less ginger ale and dark soda   Exercise - up and down steps and walking dog   Still living on her own and enjoying it   Pap 05/06/18 normal  Gyn visit Dr Philis Pique  Has a Verdia Kuba IUD No periods at all   Self breast exam -no lumps or changes   Tetanus shot 7/14  HIV screen ned 2/18  Flu shot 10/19   Blood pressure BP Readings from Last 3 Encounters:  08/21/18 116/72  03/09/18 104/66  08/15/17 106/76   H/o elevated glucose Lab Results  Component Value Date   HGBA1C 4.9 08/18/2018  good   Mood - depression /anxiety are stable and doing well overall  Job is stressful -she weather   Cholesterol  Lab Results  Component Value Date   CHOL 161 08/18/2018   CHOL 183 08/12/2017   CHOL 172 08/14/2016   Lab Results  Component Value Date   HDL 39 (L) 08/18/2018   HDL 44 08/12/2017   HDL 44 08/14/2016   Lab Results  Component Value Date   LDLCALC 95 08/18/2018   LDLCALC 120 (H) 08/12/2017   LDLCALC 101 (H) 08/14/2016   Lab Results  Component Value Date   TRIG 136 08/18/2018   TRIG 97 08/12/2017   TRIG 133 08/14/2016   Lab Results  Component Value Date   CHOLHDL 4.1 08/18/2018   CHOLHDL 4.2 08/12/2017   CHOLHDL 3.9 08/14/2016   No results found for: LDLDIRECT LDL is down to 95 from 120 /avoiding fries  HDL is 39   Lab Results  Component  Value Date   WBC 8.1 08/18/2018   HGB 14.2 08/18/2018   HCT 43.4 08/18/2018   MCV 91 08/18/2018   PLT 314 08/18/2018   Lab Results  Component Value Date   CREATININE 0.68 08/18/2018   BUN 11 08/18/2018   NA 141 08/18/2018   K 4.2 08/18/2018   CL 106 08/18/2018   CO2 25 08/18/2018   Lab Results  Component Value Date   ALT 21 08/18/2018   AST 17 08/18/2018   ALKPHOS 59 08/18/2018   BILITOT 0.5 08/18/2018     Patient Active Problem List   Diagnosis Date Noted  . Lactose intolerance 03/11/2017  . Insomnia 12/28/2012  . TMJ (temporomandibular joint disorder) 12/02/2011  . Migraine 11/08/2011  . Routine general medical examination at a health care facility 07/21/2011  . Morbid obesity ([facility]) 03/09/2010  . DEPRESSION/ANXIETY 08/16/2009  . Elevated random blood glucose level 02/10/2009  . 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 Anxiety   . Depression   . Headache(784.0)   . Hypoglycemia   . Obesity    History reviewed. No pertinent surgical history. Social  History   Tobacco Use  . Smoking status: Never Smoker  . Smokeless tobacco: Never Used  Substance Use Topics  . Alcohol use: Yes    Alcohol/week: 0.0 standard drinks    Comment: Occasional  . Drug use: No   Family History  Problem Relation Age of Onset  . Diabetes Maternal Grandmother   . Heart disease Maternal Grandfather   . Cancer Paternal Grandfather        cancer   Allergies  Allergen Reactions  . Eletriptan Hydrobromide     REACTION: chest pain  . Topiramate     REACTION: no help   Current Outpatient Medications on File Prior to Visit  Medication Sig Dispense Refill  . FLUoxetine (PROZAC) 40 MG capsule Take by mouth daily.    . Levonorgestrel (KYLEENA) 19.5 MG IUD by Intrauterine route.    . traZODone (DESYREL) 50 MG tablet Take 1 tablet by mouth at bedtime as needed.     No current facility-administered medications on file prior to visit.     Review of Systems    Constitutional: Negative for activity change, appetite change, fatigue, fever and unexpected weight change.  HENT: Negative for congestion, ear pain, rhinorrhea, sinus pressure and sore throat.   Eyes: Negative for pain, redness and visual disturbance.  Respiratory: Negative for cough, shortness of breath and wheezing.   Cardiovascular: Negative for chest pain and palpitations.  Gastrointestinal: Negative for abdominal pain, blood in stool, constipation and diarrhea.  Endocrine: Negative for polydipsia and polyuria.  Genitourinary: Negative for dysuria, frequency and urgency.  Musculoskeletal: Negative for arthralgias, back pain and myalgias.  Skin: Negative for pallor and rash.  Allergic/Immunologic: Negative for environmental allergies.  Neurological: Negative for dizziness, syncope and headaches.  Hematological: Negative for adenopathy. Does not bruise/bleed easily.  Psychiatric/Behavioral: Negative for decreased concentration and dysphoric mood. The patient is not nervous/anxious.        Objective:   Physical Exam Constitutional:      General: She is not in acute distress.    Appearance: She is well-developed. She is obese. She is not ill-appearing.  HENT:     Head: Normocephalic and atraumatic.     Right Ear: Tympanic membrane, ear canal and external ear normal.     Left Ear: Tympanic membrane, ear canal and external ear normal.     Nose: Nose normal.     Mouth/Throat:     Mouth: Mucous membranes are moist.     Pharynx: Oropharynx is clear.  Eyes:     General: No scleral icterus.       Right eye: No discharge.        Left eye: No discharge.     Conjunctiva/sclera: Conjunctivae normal.     Pupils: Pupils are equal, round, and reactive to light.  Neck:     Musculoskeletal: Normal range of motion and neck supple. No muscular tenderness.     Thyroid: No thyromegaly.     Vascular: No carotid bruit or JVD.  Cardiovascular:     Rate and Rhythm: Normal rate and regular rhythm.      Heart sounds: Normal heart sounds. No gallop.   Pulmonary:     Effort: Pulmonary effort is normal. No respiratory distress.     Breath sounds: Normal breath sounds. No wheezing or rales.  Abdominal:     General: Bowel sounds are normal. There is no distension.     Palpations: Abdomen is soft. There is no mass.     Tenderness: There is no  abdominal tenderness.  Genitourinary:    Comments: Breast and pelvic exams done by gyn Musculoskeletal:        General: No tenderness.     Right lower leg: No edema.     Left lower leg: No edema.  Lymphadenopathy:     Cervical: No cervical adenopathy.  Skin:    General: Skin is warm and dry.     Coloration: Skin is not pale.     Findings: No erythema or rash.  Neurological:     General: No focal deficit present.     Mental Status: She is alert.     Cranial Nerves: No cranial nerve deficit.     Motor: No abnormal muscle tone.     Coordination: Coordination normal.     Deep Tendon Reflexes: Reflexes are normal and symmetric. Reflexes normal.  Psychiatric:        Mood and Affect: Mood normal.     Comments: Cheerful and talkative            Assessment & Plan:   Problem List Items Addressed This Visit      Other   Elevated random blood glucose level    Lab Results  Component Value Date   HGBA1C 4.9 08/18/2018   Does not seem to be a problem disc imp of low glycemic diet and wt loss to prevent DM2       Morbid obesity ([facility])    Discussed how this problem influences overall health and the risks it imposes  Reviewed plan for weight loss with lower calorie diet (via better food choices and also portion control or program like weight watchers) and exercise building up to or more than 30 minutes 5 days per week including some aerobic activity   Pt continues to f/u at her bariatric surgery office -but has not scheduled surgery yet  Is loosing weight on her own  Still has a lot of food pref barriers Enc exercise  Enc less processed  foods       DEPRESSION/ANXIETY    Doing very well with current tx (prozax and trazodone) Enc good self care and also addition of exercise      Routine general medical examination at a health care facility - Primary    Reviewed health habits including diet and exercise and skin cancer prevention Reviewed appropriate screening tests for age  Also reviewed health mt list, fam hx and immunization status , as well as social and family history   See HPI Labs rev  Disc exercise to raise HDL Enc her to continue loosing weight

## 2018-08-22 NOTE — Assessment & Plan Note (Signed)
Doing very well with current tx (prozax and trazodone) Enc good self care and also addition of exercise

## 2018-08-22 NOTE — Assessment & Plan Note (Signed)
Discussed how this problem influences overall health and the risks it imposes  Reviewed plan for weight loss with lower calorie diet (via better food choices and also portion control or program like weight watchers) and exercise building up to or more than 30 minutes 5 days per week including some aerobic activity   Pt continues to f/u at her bariatric surgery office -but has not scheduled surgery yet  Is loosing weight on her own  Still has a lot of food pref barriers Enc exercise  Enc less processed foods

## 2018-08-22 NOTE — Assessment & Plan Note (Signed)
Reviewed health habits including diet and exercise and skin cancer prevention Reviewed appropriate screening tests for age  Also reviewed health mt list, fam hx and immunization status , as well as social and family history   See HPI Labs rev  Disc exercise to raise HDL Enc her to continue loosing weight

## 2018-08-22 NOTE — Assessment & Plan Note (Signed)
Lab Results  Component Value Date   HGBA1C 4.9 08/18/2018   Does not seem to be a problem disc imp of low glycemic diet and wt loss to prevent DM2

## 2019-08-04 ENCOUNTER — Ambulatory Visit (INDEPENDENT_AMBULATORY_CARE_PROVIDER_SITE_OTHER): Payer: Self-pay | Admitting: *Deleted

## 2019-08-04 ENCOUNTER — Other Ambulatory Visit: Payer: Self-pay

## 2019-08-04 VITALS — Ht 61.0 in | Wt 275.2 lb

## 2019-08-04 DIAGNOSIS — N898 Other specified noninflammatory disorders of vagina: Secondary | ICD-10-CM

## 2019-08-04 DIAGNOSIS — R350 Frequency of micturition: Secondary | ICD-10-CM

## 2019-08-04 DIAGNOSIS — Z9189 Other specified personal risk factors, not elsewhere classified: Secondary | ICD-10-CM

## 2019-08-04 DIAGNOSIS — Z113 Encounter for screening for infections with a predominantly sexual mode of transmission: Secondary | ICD-10-CM

## 2019-08-04 NOTE — Progress Notes (Addendum)
Pt presents for nurse visit with multiple complaints. She states that she observed cloudy urine 1 week ago, however is clear now. She has frequency of urination however denies pain. Pt also reports vaginal itching with no discharge or bleeding. Due to several episodes of unprotected sex, pt requested testing for all STI's includng HIV and Syphilis. Self swab was obtained for wet prep and  blood drawn. Pt was unable to void for UA. She was advised to monitor for sx of UTI and notify us of worsening sx. Pt will be notified of test results via MyChart. She voiced understanding of all information and instructions given.   Chart reviewed for nurse visit. Agree with plan of care.   Virginia Rochester, NP 08/04/2019 9:23 PM

## 2019-08-05 LAB — RPR: RPR Ser Ql: NONREACTIVE

## 2019-08-05 LAB — HIV ANTIBODY (ROUTINE TESTING W REFLEX): HIV Screen 4th Generation wRfx: NONREACTIVE

## 2019-08-06 LAB — CERVICOVAGINAL ANCILLARY ONLY
Bacterial Vaginitis (gardnerella): NEGATIVE
Candida Glabrata: NEGATIVE
Candida Vaginitis: NEGATIVE
Chlamydia: NEGATIVE
Comment: NEGATIVE
Comment: NEGATIVE
Comment: NEGATIVE
Comment: NEGATIVE
Comment: NEGATIVE
Comment: NORMAL
Neisseria Gonorrhea: POSITIVE — AB
Trichomonas: POSITIVE — AB

## 2019-08-09 ENCOUNTER — Telehealth: Payer: Self-pay | Admitting: *Deleted

## 2019-08-09 NOTE — Telephone Encounter (Addendum)
-----   Message from Virginia Rochester, NP sent at 08/09/2019  8:12 AM EST ----- Please call client.  She will need to come in for treatment for GC.  And we can prescribe medication for trichomonas.  Don't want her to only get treatment for trichomonas without getting GC treated.  Advise partner will need to be treated for both.  61  Pt notified of lab results showing +GC and +Trichomonas infections. She will require Tx in the office for GC (injection) and we will send Rx for the Trich @ time of her appt. Pt's partner will also require treatment and can schedule with his PCP or local Health Department. Pt voiced understanding of all information and instructions given. She accepted office appointment on 2/17 @ 0830 to receive treatment.

## 2019-08-11 ENCOUNTER — Ambulatory Visit (INDEPENDENT_AMBULATORY_CARE_PROVIDER_SITE_OTHER): Payer: Self-pay | Admitting: General Practice

## 2019-08-11 ENCOUNTER — Other Ambulatory Visit: Payer: Self-pay

## 2019-08-11 VITALS — BP 126/82 | HR 87 | Ht 61.0 in | Wt 276.0 lb

## 2019-08-11 DIAGNOSIS — A599 Trichomoniasis, unspecified: Secondary | ICD-10-CM

## 2019-08-11 DIAGNOSIS — B9689 Other specified bacterial agents as the cause of diseases classified elsewhere: Secondary | ICD-10-CM

## 2019-08-11 DIAGNOSIS — N760 Acute vaginitis: Secondary | ICD-10-CM

## 2019-08-11 DIAGNOSIS — A549 Gonococcal infection, unspecified: Secondary | ICD-10-CM

## 2019-08-11 MED ORDER — AZITHROMYCIN 250 MG PO TABS: 1000.0000 mg | ORAL_TABLET | Freq: Once | ORAL | 0 refills | Status: AC

## 2019-08-11 MED ORDER — METRONIDAZOLE 500 MG PO TABS: 500.0000 mg | ORAL_TABLET | Freq: Two times a day (BID) | ORAL | 0 refills | Status: DC

## 2019-08-11 MED ORDER — CEFTRIAXONE SODIUM 500 MG IJ SOLR
500.0000 mg | Freq: Once | INTRAMUSCULAR | Status: AC
  Administered 2019-08-11: 09:00:00 500 mg via INTRAMUSCULAR

## 2019-08-11 NOTE — Progress Notes (Signed)
Fernande Bras here for Rocephin  Injection.  Injection administered without complication. Zithromax sent to pharmacy as well as Flagyl for trichomonas infection. Patient will return in 4 weeks for test of cure.  Derinda Late, RN 08/11/2019  9:11 AM

## 2019-08-11 NOTE — Progress Notes (Signed)
Patient seen and assessed by nursing staff during this encounter. I have reviewed the chart and agree with the documentation and plan.  Verita Schneiders, MD 08/11/2019 1:09 PM

## 2019-08-19 ENCOUNTER — Telehealth: Payer: Self-pay | Admitting: Family Medicine

## 2019-08-19 DIAGNOSIS — R7309 Other abnormal glucose: Secondary | ICD-10-CM

## 2019-08-19 DIAGNOSIS — Z0000 Encounter for general adult medical examination without abnormal findings: Secondary | ICD-10-CM

## 2019-08-19 NOTE — Telephone Encounter (Signed)
-----   Message from Ellamae Sia sent at 08/06/2019  9:45 AM EST ----- Regarding: Lab orders for Friday, 2.26.21 Patient is scheduled for CPX labs, please order future labs, Thanks , Karna Christmas

## 2019-08-20 ENCOUNTER — Other Ambulatory Visit: Payer: Managed Care, Other (non HMO)

## 2019-08-27 ENCOUNTER — Encounter: Payer: Managed Care, Other (non HMO) | Admitting: Family Medicine

## 2019-09-08 ENCOUNTER — Other Ambulatory Visit: Payer: Self-pay

## 2019-09-08 ENCOUNTER — Ambulatory Visit (INDEPENDENT_AMBULATORY_CARE_PROVIDER_SITE_OTHER): Payer: Self-pay | Admitting: *Deleted

## 2019-09-08 DIAGNOSIS — Z113 Encounter for screening for infections with a predominantly sexual mode of transmission: Secondary | ICD-10-CM

## 2019-09-08 DIAGNOSIS — Z8619 Personal history of other infectious and parasitic diseases: Secondary | ICD-10-CM

## 2019-09-08 NOTE — Progress Notes (Signed)
Here for test of cure for gonorrhea. Will do self swab. Also wants to check for trich .  Math Brazie,RN

## 2019-09-08 NOTE — Progress Notes (Signed)
Patient seen and assessed by nursing staff.  Agree with documentation and plan.  

## 2019-09-09 LAB — CERVICOVAGINAL ANCILLARY ONLY
Chlamydia: NEGATIVE
Comment: NEGATIVE
Comment: NEGATIVE
Comment: NORMAL
Neisseria Gonorrhea: NEGATIVE
Trichomonas: NEGATIVE

## 2020-01-27 ENCOUNTER — Encounter (HOSPITAL_BASED_OUTPATIENT_CLINIC_OR_DEPARTMENT_OTHER): Payer: Self-pay | Admitting: *Deleted

## 2020-01-27 ENCOUNTER — Emergency Department (HOSPITAL_BASED_OUTPATIENT_CLINIC_OR_DEPARTMENT_OTHER): Payer: 59

## 2020-01-27 ENCOUNTER — Emergency Department (HOSPITAL_BASED_OUTPATIENT_CLINIC_OR_DEPARTMENT_OTHER)
Admission: EM | Admit: 2020-01-27 | Discharge: 2020-01-27 | Disposition: A | Discharge status: Home / Self Care | Payer: 59 | Attending: Emergency Medicine | Admitting: Emergency Medicine

## 2020-01-27 ENCOUNTER — Other Ambulatory Visit: Payer: Self-pay

## 2020-01-27 ENCOUNTER — Telehealth: Payer: Self-pay

## 2020-01-27 DIAGNOSIS — R1030 Lower abdominal pain, unspecified: Secondary | ICD-10-CM | POA: Insufficient documentation

## 2020-01-27 DIAGNOSIS — K625 Hemorrhage of anus and rectum: Secondary | ICD-10-CM | POA: Diagnosis not present

## 2020-01-27 DIAGNOSIS — K921 Melena: Secondary | ICD-10-CM | POA: Diagnosis present

## 2020-01-27 LAB — URINALYSIS, ROUTINE W REFLEX MICROSCOPIC
Bilirubin Urine: NEGATIVE
Glucose, UA: NEGATIVE mg/dL
Hgb urine dipstick: NEGATIVE
Ketones, ur: NEGATIVE mg/dL
Nitrite: NEGATIVE
Protein, ur: NEGATIVE mg/dL
Specific Gravity, Urine: 1.02 (ref 1.005–1.030)
pH: 6 (ref 5.0–8.0)

## 2020-01-27 LAB — URINALYSIS, MICROSCOPIC (REFLEX)

## 2020-01-27 LAB — COMPREHENSIVE METABOLIC PANEL
ALT: 24 U/L (ref 0–44)
AST: 21 U/L (ref 15–41)
Albumin: 3.4 g/dL — ABNORMAL LOW (ref 3.5–5.0)
Alkaline Phosphatase: 65 U/L (ref 38–126)
Anion gap: 10 (ref 5–15)
BUN: 11 mg/dL (ref 6–20)
CO2: 26 mmol/L (ref 22–32)
Calcium: 8.6 mg/dL — ABNORMAL LOW (ref 8.9–10.3)
Chloride: 101 mmol/L (ref 98–111)
Creatinine, Ser: 0.9 mg/dL (ref 0.44–1.00)
GFR calc Af Amer: >60 mL/min (ref >60)
GFR calc non Af Amer: >60 mL/min (ref >60)
Glucose, Bld: 100 mg/dL — ABNORMAL HIGH (ref 70–99)
Potassium: 3.7 mmol/L (ref 3.5–5.1)
Sodium: 137 mmol/L (ref 135–145)
Total Bilirubin: 0.2 mg/dL — ABNORMAL LOW (ref 0.3–1.2)
Total Protein: 6.9 g/dL (ref 6.5–8.1)

## 2020-01-27 LAB — CBC
HCT: 46.2 % — ABNORMAL HIGH (ref 36.0–46.0)
Hemoglobin: 15 g/dL (ref 12.0–15.0)
MCH: 29 pg (ref 26.0–34.0)
MCHC: 32.5 g/dL (ref 30.0–36.0)
MCV: 89.4 fL (ref 80.0–100.0)
Platelets: 356 10*3/uL (ref 150–400)
RBC: 5.17 MIL/uL — ABNORMAL HIGH (ref 3.87–5.11)
RDW: 13.1 % (ref 11.5–15.5)
WBC: 9.6 10*3/uL (ref 4.0–10.5)
nRBC: 0 % (ref 0.0–0.2)

## 2020-01-27 LAB — PREGNANCY, URINE: Preg Test, Ur: NEGATIVE

## 2020-01-27 LAB — OCCULT BLOOD X 1 CARD TO LAB, STOOL: Fecal Occult Bld: NEGATIVE

## 2020-01-27 LAB — LIPASE, BLOOD: Lipase: 34 U/L (ref 11–51)

## 2020-01-27 MED ORDER — IOHEXOL 300 MG/ML  SOLN
100.0000 mL | Freq: Once | INTRAMUSCULAR | Status: AC | PRN
  Administered 2020-01-27: 100 mL via INTRAVENOUS

## 2020-01-27 MED ORDER — SODIUM CHLORIDE 0.9 % IV BOLUS
1000.0000 mL | Freq: Once | INTRAVENOUS | Status: AC
  Administered 2020-01-27: 1000 mL via INTRAVENOUS

## 2020-01-27 MED ORDER — SODIUM CHLORIDE 0.9% FLUSH
3.0000 mL | Freq: Once | INTRAVENOUS | Status: DC
  Filled 2020-01-27: qty 3

## 2020-01-27 MED ORDER — PREDNISONE 50 MG PO TABS: 50.0000 mg | ORAL_TABLET | Freq: Every day | ORAL | 0 refills | Status: AC

## 2020-01-27 MED ORDER — PANTOPRAZOLE SODIUM 20 MG PO TBEC
20.0000 mg | DELAYED_RELEASE_TABLET | Freq: Every day | ORAL | 1 refills | Status: DC
Start: 2020-01-27 — End: 2020-02-18

## 2020-01-27 NOTE — Discharge Instructions (Addendum)
Follow up with GI  Take the medications as prescribed.

## 2020-01-27 NOTE — ED Notes (Signed)
Pt reports she had blood in her stool last night and also last week once.  Pt. Reports a burning in lower abd. For a month.  Pt. Reports no vomiting.

## 2020-01-27 NOTE — Telephone Encounter (Signed)
Pt r/t call, Rena unavailable--transferred to access nurse but they are not available... her information given and they will call pt back

## 2020-01-27 NOTE — Telephone Encounter (Signed)
[facility] - Client TELEPHONE ADVICE RECORD [facility] Patient Name: Karla Grant Gender: Female DOB: [birth year] Age: 29 Y 44 M Return Phone Number: [phone] (Primary), [phone] (Secondary) Address: [address] City/State/Zip: [place] [address] Client [facility] - Client Client Site Warson Woods MD Contact Type Call Who Is Calling Patient / Member / Family / Caregiver Call Type Triage / Clinical Relationship To Patient Self Return Phone Number [phone] (Secondary) Chief Complaint Blood In Stool Reason for Call Symptomatic / Request for Health Information Initial Comment Caller reports that she has blood in stool and abdominal pain [place] Not Listed lestly long ER Translation No Nurse Assessment Nurse: Loletha Carrow, RN, Ronalee Belts Date/Time (Eastern Time): 01/27/2020 11:32:13 AM Confirm and document reason for call. If symptomatic, describe symptoms. ---Caller states: 2/10 constant lower abd pain X 2 weeks blood in stool yesterday. Denies and other current symptoms Has the patient had close contact with a person known or suspected to have the novel coronavirus illness OR traveled / lives in area with major community spread (including international travel) in the last 14 days from the onset of symptoms? * If Asymptomatic, screen for exposure and travel within the last 14 days. ---No Does the patient have any new or worsening symptoms? ---Yes Will a triage be completed? ---Yes Related visit to physician within the last 2 weeks? ---No Does the PT have any chronic conditions? (i.e. diabetes, asthma, this includes High risk factors for pregnancy, etc.) ---Yes List chronic conditions. ---anxiety Is the patient pregnant or possibly pregnant? (Ask all females between the ages of 58-55) ---No Is this a behavioral health or substance abuse call?  ---No Guidelines Guideline Title Affirmed Question Affirmed Notes Nurse Date/Time (Eastern Time) Rectal Bleeding SEVERE rectal bleeding (large blood clots; on and off, or constant bleeding) Emch, RN, Ronalee Belts 01/27/2020 11:34:25 AM Disp. Time Eilene Ghazi Time) Disposition Final User PLEASE NOTE: All timestamps contained within this report are represented as [place] Standard Time. CONFIDENTIALTY NOTICE: This fax transmission is intended only for the addressee. It contains information that is legally privileged, confidential or otherwise protected from use or disclosure. If you are not the intended recipient, you are strictly prohibited from reviewing, disclosing, copying using or disseminating any of this information or taking any action in reliance on or regarding this information. If you have received this fax in error, please notify us immediately by telephone so that we can arrange for its return to [place]. Phone: [phone], Toll-Free: [phone], Fax: [phone] Page: 2 of 2 Call Id: [id number] 01/27/2020 11:39:50 AM Go to ED Now Yes Emch, RN, Vicenta Dunning Disagree/Comply Comply Caller Understands Yes PreDisposition Did not know what to do Care Advice Given Per Guideline DRIVING: * Another adult should drive. GO TO ED NOW: * Leave now. Drive carefully. CARE ADVICE given per Rectal Bleeding (Adult) guideline. BRING MEDICINES: * Please bring a list of your current medicines when you go to the Emergency Department (ER). * It is also a good idea to bring the pill bottles too. This will help the doctor to make certain you are taking the right medicines and the right dose. Referrals GO TO FACILITY OTHER - SPECIFY

## 2020-01-27 NOTE — ED Provider Notes (Signed)
[place] EMERGENCY DEPARTMENT Provider Note   CSN: [id number] Arrival date & time: 01/27/20  1223    History Chief Complaint  Patient presents with  . Blood In Stools   Karla Grant is a 29 y.o. female with past medical history significant for morbid obesity, lactose intolerance who presents for evaluation of blood in stool.  Patient states 2 weeks ago she had a bowel movement she noticed blood on the toilet paper with wiping.  This is bright red.  Patient states last night when she had a bowel movement she noticed blood initially on the toilet paper and also noticed a small amount in the toilet.  States this was bright red.  She has no pain with bowel movements, and known hemorrhoids.  No chronic NSAID use, alcohol use, anticoagulants.  No personal history of diverticulitis and has never had a colonoscopy.  States she does have some diffuse burning and cramping to her lower abdomen however worse to her left lower quadrant.  Is not sexually active and has no concerns for STDs.  No pelvic pain, vaginal discharge, dysuria.  No diarrhea or constipation.  Denies fever, chills, nausea, vomiting, chest pain, shortness of breath.  Denies aggravating or alleviating factors.  She denies any current pain.   History obtained from patient and past medical records.  No interpreter is used.  HPI     Past Medical History:  Diagnosis Date  . Anxiety   . Depression   . Headache(784.0)   . Hypoglycemia   . Obesity     Patient Active Problem List   Diagnosis Date Noted  . Lactose intolerance 03/11/2017  . Insomnia 12/28/2012  . TMJ (temporomandibular joint disorder) 12/02/2011  . Migraine 11/08/2011  . Routine general medical examination at a health care facility 07/21/2011  . Morbid obesity ([place]) 03/09/2010  . DEPRESSION/ANXIETY 08/16/2009  . Elevated random blood glucose level 02/10/2009  . 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 No pertinent surgical history.   OB  History   No obstetric history on file.     Family History  Problem Relation Age of Onset  . Diabetes Maternal Grandmother   . Heart disease Maternal Grandfather   . Cancer Paternal Grandfather        cancer    Social History   Tobacco Use  . Smoking status: Never Smoker  . Smokeless tobacco: Never Used  Substance Use Topics  . Alcohol use: Yes    Alcohol/week: 0.0 standard drinks    Comment: Occasional  . Drug use: No    Home Medications Prior to Admission medications   Medication Sig Start Date End Date Taking? Authorizing Provider  amphetamine-dextroamphetamine (ADDERALL XR) 20 MG 24 hr capsule Take 20 mg by mouth daily.   Yes [provider]  buPROPion (WELLBUTRIN XL) 300 MG 24 hr tablet Take 300 mg by mouth daily.    Yes [provider]  FLUoxetine (PROZAC) 20 MG capsule Take 60 mg by mouth daily.    Yes [provider]  Levonorgestrel (KYLEENA) 19.5 MG IUD 1 each by Intrauterine route once.    Yes [provider]  traZODone (DESYREL) 100 MG tablet Take 100 mg by mouth at bedtime as needed for sleep.    Yes [provider]  metroNIDAZOLE (FLAGYL) 500 MG tablet Take 1 tablet (500 mg total) by mouth 2 (two) times daily. 08/11/19   Anyanwu, Sallyanne Havers, MD  pantoprazole (PROTONIX) 20 MG tablet Take 1 tablet (20 mg total)  by mouth daily. 01/27/20   Shylynn Bruning A, PA-C  predniSONE (DELTASONE) 50 MG tablet Take 1 tablet (50 mg total) by mouth daily for 5 days. 01/27/20 02/01/20  Donique Hammonds A, PA-C    Allergies    Eletriptan hydrobromide and Topiramate  Review of Systems   Review of Systems  Constitutional: Negative.   HENT: Negative.   Respiratory: Negative.   Cardiovascular: Negative.   Gastrointestinal: Positive for abdominal pain, blood in stool and rectal pain. Negative for abdominal distention, anal bleeding, constipation, diarrhea, nausea and vomiting.  Genitourinary: Negative.   Musculoskeletal: Negative.   Skin:  Negative.   Neurological: Negative.   All other systems reviewed and are negative.  Physical Exam Updated Vital Signs BP 108/70   Pulse 90   Temp 98.4 F (36.9 C) (Oral)   Resp 18   Ht 5' 1.5" (1.562 m)   Wt 126.1 kg   SpO2 100%   BMI 51.70 kg/m   Physical Exam Vitals and nursing note reviewed. Exam conducted with a chaperone present.  Constitutional:      General: She is not in acute distress.    Appearance: She is well-developed. She is not ill-appearing, toxic-appearing or diaphoretic.  HENT:     Head: Normocephalic and atraumatic.     Nose: Nose normal.     Mouth/Throat:     Mouth: Mucous membranes are moist.  Eyes:     Pupils: Pupils are equal, round, and reactive to light.  Cardiovascular:     Rate and Rhythm: Normal rate.     Pulses: Normal pulses.          Radial pulses are 2+ on the right side and 2+ on the left side.       Dorsalis pedis pulses are 2+ on the right side and 2+ on the left side.     Heart sounds: Normal heart sounds.  Pulmonary:     Effort: Pulmonary effort is normal. No respiratory distress.     Breath sounds: Normal breath sounds and air entry.  Abdominal:     General: Bowel sounds are normal. There is no distension.     Palpations: Abdomen is soft.     Tenderness: There is no abdominal tenderness. There is no right CVA tenderness, left CVA tenderness, guarding or rebound. Negative signs include Murphy's sign and McBurney's sign.     Hernia: No hernia is present.     Comments: Soft without rebound or guarding.  No tenderness on exam  Genitourinary:    Rectum: Normal. Guaiac result negative. No mass, tenderness, anal fissure, external hemorrhoid or internal hemorrhoid. Normal anal tone.     Comments: GU exam with very minimal stool in rectal vault, light brown Musculoskeletal:        General: Normal range of motion.     Cervical back: Normal range of motion.  Skin:    General: Skin is warm and dry.     Capillary Refill: Capillary refill  takes less than 2 seconds.  Neurological:     Mental Status: She is alert.    ED Results / Procedures / Treatments   Labs (all labs ordered are listed, but only abnormal results are displayed) Labs Reviewed  COMPREHENSIVE METABOLIC PANEL - Abnormal; Notable for the following components:      Result Value   Glucose, Bld 100 (*)    Calcium 8.6 (*)    Albumin 3.4 (*)    Total Bilirubin 0.2 (*)    All other components within  normal limits  CBC - Abnormal; Notable for the following components:   RBC 5.17 (*)    HCT 46.2 (*)    All other components within normal limits  URINALYSIS, ROUTINE W REFLEX MICROSCOPIC - Abnormal; Notable for the following components:   Leukocytes,Ua TRACE (*)    All other components within normal limits  URINALYSIS, MICROSCOPIC (REFLEX) - Abnormal; Notable for the following components:   Bacteria, UA FEW (*)    All other components within normal limits  LIPASE, BLOOD  PREGNANCY, URINE  OCCULT BLOOD X 1 CARD TO LAB, STOOL    EKG None  Radiology CT ABDOMEN PELVIS W CONTRAST  Result Date: 01/27/2020 CLINICAL DATA:  Left lower quadrant abdominal pain, burning sensation, bright red blood per rectum EXAM: CT ABDOMEN AND PELVIS WITH CONTRAST TECHNIQUE: Multidetector CT imaging of the abdomen and pelvis was performed using the standard protocol following bolus administration of intravenous contrast. CONTRAST:  167mL OMNIPAQUE IOHEXOL 300 MG/ML  SOLN COMPARISON:  None FINDINGS: Lower chest: Lung bases are clear. Normal heart size. No pericardial effusion. Hepatobiliary: Hepatic attenuation and enhancement within expected normals for phase of contrast. Smooth surface contour. No focal concerning liver lesions. Gallbladder and biliary tree are unremarkable without visible calcified gallstones. Pancreas: Unremarkable. No pancreatic ductal dilatation or surrounding inflammatory changes. Spleen: Normal in size. No concerning splenic lesions. Adrenals/Urinary Tract: Normal  adrenal glands. Kidneys enhance symmetrically and uniformly. No concerning renal mass, urolithiasis or frank hydronephrosis. Urinary bladder is unremarkable. Stomach/Bowel: Distal esophagus, stomach and duodenal sweep are unremarkable. There is notable hazy appearance of the mesentery in the right lower quadrant (a 'misty mesentery') but without associated small bowel wall thickening or dilatation. A normal appendix is visualized. No colonic dilatation or wall thickening. Vascular/Lymphatic: Hazy mesenteric changes in right lower quadrant with some reactive appearing adenopathy. No pathologically enlarged nodes. No significant vascular findings. Reproductive: Anteverted uterus with expected positioning of an IUD. No concerning adnexal lesions. Other: No abdominopelvic free fluid or free gas. No bowel containing hernias. Musculoskeletal: No acute osseous abnormality or suspicious osseous lesion. IMPRESSION: 1. Hazy appearance of the mesentery in the right lower quadrant (a 'misty mesentery') but without associated small bowel wall thickening or dilatation. Findings may reflect a mesenteritis. Enteritis less favored without associated bowel findings. 2. No other acute abnormality in the abdomen or pelvis. 3. Normally positioned IUD. Electronically Signed   By: Lovena Le M.D.   On: 01/27/2020 16:54    Procedures Procedures (including critical care time)  Medications Ordered in ED Medications  sodium chloride flush (NS) 0.9 % injection 3 mL (3 mLs Intravenous Not Given 01/27/20 1540)  sodium chloride 0.9 % bolus 1,000 mL (1,000 mLs Intravenous New Bag/Given 01/27/20 1540)  iohexol (OMNIPAQUE) 300 MG/ML solution 100 mL (100 mLs Intravenous Contrast Given 01/27/20 1638)   ED Course  I have reviewed the triage vital signs and the nursing notes.  Pertinent labs & imaging results that were available during my care of the patient were reviewed by me and considered in my medical decision making (see chart for  details).  29 year old presents for evaluation of rectal bleeding.  2 episodes over the last 2 weeks.  No pain with bowel movement.  She is afebrile, nonseptic, not ill-appearing.  Has had some diffuse lower abdominal cramping pain.  No pelvic pain, vaginal discharge or concerns for STDs.  She is tolerating p.o. intake at home.  She actually has no pain currently on exam.  She is not followed by GI.  Never had colonoscopy.  No history of diverticulitis.  Lungs clear.  Abdomen some mild tenderness diffusely to the lower regions however no rebound or guarding. Plan on labs, imaging and reassess  Labs and imaging personally reviewed and interpreted: CBC without leukocytosis, hemoglobin 83.3 Metabolic panel with mild hyperglycemia to 100 however no electrolyte, renal or normality Pregnancy test negative Urinalysis with few leukocytes, few bacteria, no urinary complaints. CT AP with hazy mesentery possible enteritis vs mesenteritis Occult negative  Patient reassessed. No current pain. Light brown stool on exam.  Discussed CT findings.  Will start on steroids, Pepcid and have her follow-up outpatient with GI.  Tolerating p.o. intake in ED without difficulty.  There is overall well.  Occult negative here.  Stable blood pressure and vital signs.   Patient is nontoxic, nonseptic appearing, in no apparent distress.  Patient's pain and other symptoms adequately managed in emergency department.  Fluid bolus given.  Labs, imaging and vitals reviewed.  Patient does not meet the SIRS or Sepsis criteria.  On repeat exam patient does not have a surgical abdomin and there are no peritoneal signs.  No indication of appendicitis, bowel obstruction, bowel perforation, cholecystitis, diverticulitis, PID or ectopic pregnancy.  Patient discharged home with symptomatic treatment and given strict instructions for follow-up with their primary care physician.  I have also discussed reasons to return immediately to the ER.   Patient expresses understanding and agrees with plan.    MDM Rules/Calculators/A&P                           Final Clinical Impression(s) / ED Diagnoses Final diagnoses:  Rectal bleeding  Lower abdominal pain    Rx / DC Orders ED Discharge Orders         Ordered    predniSONE (DELTASONE) 50 MG tablet  Daily     Discontinue  Reprint     01/27/20 1736    pantoprazole (PROTONIX) 20 MG tablet  Daily     Discontinue  Reprint     01/27/20 1736           Renwick Asman A, PA-C 01/27/20 1739    Sherwood Gambler, MD 01/27/20 1925

## 2020-01-27 NOTE — Telephone Encounter (Signed)
Aware, I will watch for correspondence

## 2020-01-27 NOTE — Telephone Encounter (Signed)
Per chart review tab pt went to [place]. 

## 2020-01-27 NOTE — ED Triage Notes (Signed)
Abdominal burning sensation x 2 weeks. Blood in her stool 2 weeks ago. Last night she has a small amount of bright red blood in her stool.

## 2020-01-27 NOTE — Telephone Encounter (Signed)
Emily sent teams message that pt requesting my chart appt for blood in stool and burning sensation in stomach. I called pt to triage; left v/m requesting pt to call [facility] [phone] option 4 for triage.

## 2020-01-28 ENCOUNTER — Encounter: Payer: Self-pay | Admitting: Nurse Practitioner

## 2020-01-31 ENCOUNTER — Other Ambulatory Visit: Payer: Self-pay | Admitting: Gastroenterology

## 2020-02-02 ENCOUNTER — Telehealth: Payer: Self-pay | Admitting: Family Medicine

## 2020-02-02 NOTE — Telephone Encounter (Signed)
Called patient and discussed records.

## 2020-02-02 NOTE — Telephone Encounter (Signed)
Patient returned Alice's call.  Patient can be reached at [phone].

## 2020-02-15 ENCOUNTER — Other Ambulatory Visit (HOSPITAL_COMMUNITY)
Admission: RE | Admit: 2020-02-15 | Discharge: 2020-02-15 | Disposition: A | Discharge status: Home / Self Care | Payer: 59 | Source: Ambulatory Visit | Attending: Gastroenterology | Admitting: Gastroenterology

## 2020-02-15 DIAGNOSIS — Z01812 Encounter for preprocedural laboratory examination: Secondary | ICD-10-CM | POA: Insufficient documentation

## 2020-02-15 DIAGNOSIS — Z20822 Contact with and (suspected) exposure to covid-19: Secondary | ICD-10-CM | POA: Diagnosis not present

## 2020-02-15 LAB — SARS CORONAVIRUS 2 (TAT 6-24 HRS): SARS Coronavirus 2: NEGATIVE

## 2020-02-18 ENCOUNTER — Encounter (HOSPITAL_COMMUNITY): Payer: Self-pay | Admitting: Gastroenterology

## 2020-02-18 ENCOUNTER — Ambulatory Visit (HOSPITAL_COMMUNITY): Payer: 59 | Admitting: Certified Registered Nurse Anesthetist

## 2020-02-18 ENCOUNTER — Ambulatory Visit (HOSPITAL_COMMUNITY)
Admission: RE | Admit: 2020-02-18 | Discharge: 2020-02-18 | Disposition: A | Discharge status: Home / Self Care | Payer: 59 | Attending: Gastroenterology | Admitting: Gastroenterology

## 2020-02-18 ENCOUNTER — Other Ambulatory Visit: Payer: Self-pay

## 2020-02-18 ENCOUNTER — Encounter (HOSPITAL_COMMUNITY)
Admission: RE | Disposition: A | Discharge status: Home / Self Care | Payer: Self-pay | Source: Home / Self Care | Attending: Gastroenterology

## 2020-02-18 DIAGNOSIS — Z6841 Body Mass Index (BMI) 40.0 and over, adult: Secondary | ICD-10-CM | POA: Insufficient documentation

## 2020-02-18 DIAGNOSIS — R948 Abnormal results of function studies of other organs and systems: Secondary | ICD-10-CM | POA: Insufficient documentation

## 2020-02-18 DIAGNOSIS — D123 Benign neoplasm of transverse colon: Secondary | ICD-10-CM | POA: Diagnosis not present

## 2020-02-18 DIAGNOSIS — K921 Melena: Secondary | ICD-10-CM | POA: Insufficient documentation

## 2020-02-18 DIAGNOSIS — Z888 Allergy status to other drugs, medicaments and biological substances status: Secondary | ICD-10-CM | POA: Insufficient documentation

## 2020-02-18 HISTORY — PX: COLONOSCOPY WITH PROPOFOL: SHX5780

## 2020-02-18 HISTORY — PX: POLYPECTOMY: SHX5525

## 2020-02-18 SURGERY — COLONOSCOPY WITH PROPOFOL
Anesthesia: Monitor Anesthesia Care

## 2020-02-18 MED ORDER — SODIUM CHLORIDE 0.9 % IV SOLN: INTRAVENOUS | Status: DC

## 2020-02-18 MED ORDER — PROPOFOL 10 MG/ML IV BOLUS
INTRAVENOUS | Status: DC | PRN
  Administered 2020-02-18 (×2): 20 mg via INTRAVENOUS

## 2020-02-18 MED ORDER — PROPOFOL 500 MG/50ML IV EMUL
INTRAVENOUS | Status: DC | PRN
  Administered 2020-02-18: 125 ug/kg/min via INTRAVENOUS

## 2020-02-18 MED ORDER — LACTATED RINGERS IV SOLN
INTRAVENOUS | Status: DC | PRN
  Administered 2020-02-18: 1000 mL via INTRAVENOUS

## 2020-02-18 SURGICAL SUPPLY — 21 items

## 2020-02-18 NOTE — Transfer of Care (Signed)
Immediate Anesthesia Transfer of Care Note  Patient: Fernande Bras  Procedure(s) Performed: COLONOSCOPY WITH PROPOFOL (N/A ) POLYPECTOMY  Patient Location: PACU and Endoscopy Unit  Anesthesia Type:MAC  Level of Consciousness: awake, alert  and oriented  Airway & Oxygen Therapy: Patient Spontanous Breathing and Patient connected to face mask oxygen  Post-op Assessment: Report given to RN and Post -op Vital signs reviewed and stable  Post vital signs: Reviewed and stable  Last Vitals:  Vitals Value Taken Time  BP    Temp    Pulse 79 02/18/20 1340  Resp 23 02/18/20 1340  SpO2 100 % 02/18/20 1340  Vitals shown include unvalidated device data.  Last Pain:  Vitals:   02/18/20 1221  TempSrc: Oral  PainSc: 2          Complications: No complications documented.

## 2020-02-18 NOTE — Anesthesia Procedure Notes (Signed)
Procedure Name: MAC Date/Time: 02/18/2020 1:14 PM Performed by: Eben Burow, CRNA Pre-anesthesia Checklist: Patient identified, Emergency Drugs available, Suction available, Patient being monitored and Timeout performed Oxygen Delivery Method: Simple face mask Placement Confirmation: positive ETCO2

## 2020-02-18 NOTE — H&P (Signed)
  Karla Grant HPI: One month ago she reported having problems with burning. At first she thought that it was from eating dairy products, but the symptoms continued to persist. A CT scan in the ER for complaints of LLQ abdominal pain showed a mesenteritis. She also complained about hematochezia x 2 episodes over the past two weeks. The blood is noticeable on the toilet tissue as well as passing a clot. A rectal examination was performed in the ER, per the patient, and it was heme negative. From the ER was started on prednisone 5 tablets and she took two. The patient did notice that the burning sensation did resolve, but she noticed a recurrence when she did not take it over this weekend.  Past Medical History:  Diagnosis Date  . Anxiety   . Depression   . Headache(784.0)   . Hypoglycemia   . Obesity     No past surgical history on file.  Family History  Problem Relation Age of Onset  . Diabetes Maternal Grandmother   . Heart disease Maternal Grandfather   . Cancer Paternal Grandfather        cancer    Social History:  reports that she has never smoked. She has never used smokeless tobacco. She reports current alcohol use. She reports that she does not use drugs.  Allergies:  Allergies  Allergen Reactions  . Eletriptan Hydrobromide     REACTION: chest pain  . Topiramate     REACTION: no help    Medications:  Scheduled:  Continuous: . sodium chloride    . lactated ringers 20 mL/hr at 02/18/20 1257    No results found for this or any previous visit (from the past 24 hour(s)).   No results found.  ROS:  As stated above in the HPI otherwise negative.  Blood pressure 138/79, pulse 84, temperature 98.4 F (36.9 C), temperature source Oral, resp. rate 16, weight 123.4 kg, SpO2 99 %.    PE: Gen: NAD, Alert and Oriented HEENT:  [place]/AT, EOMI Neck: Supple, no LAD Lungs: CTA Bilaterally CV: RRR without M/G/R ABD: Soft, NTND, +BS Ext: No  C/C/E  Assessment/Plan: 1) Hematochezia. 2) Abnormal CT scan.  Plan: 1) Colonoscopy.  Otniel Hoe D 02/18/2020, 1:01 PM

## 2020-02-18 NOTE — Anesthesia Preprocedure Evaluation (Signed)
Anesthesia Evaluation  Patient identified by MRN, date of birth, ID band Patient awake    Reviewed: Allergy & Precautions, NPO status , Patient's Chart, lab work & pertinent test results  Airway Mallampati: II       Dental no notable dental hx. (+) Teeth Intact   Pulmonary neg pulmonary ROS,    Pulmonary exam normal breath sounds clear to auscultation       Cardiovascular negative cardio ROS Normal cardiovascular exam Rhythm:Regular Rate:Normal     Neuro/Psych  Headaches, PSYCHIATRIC DISORDERS Anxiety Depression    GI/Hepatic Neg liver ROS, GERD  Medicated,  Endo/Other  Morbid obesity  Renal/GU negative Renal ROS  negative genitourinary   Musculoskeletal   Abdominal (+) + obese,   Peds  Hematology   Anesthesia Other Findings   Reproductive/Obstetrics negative OB ROS                             Anesthesia Physical Anesthesia Plan  ASA: III  Anesthesia Plan: MAC   Post-op Pain Management:    Induction:   PONV Risk Score and Plan: Propofol infusion and Treatment may vary due to age or medical condition  Airway Management Planned: Natural Airway and Mask  Additional Equipment: None  Intra-op Plan:   Post-operative Plan:   Informed Consent: I have reviewed the patients History and Physical, chart, labs and discussed the procedure including the risks, benefits and alternatives for the proposed anesthesia with the patient or authorized representative who has indicated his/her understanding and acceptance.     Dental advisory given  Plan Discussed with: CRNA  Anesthesia Plan Comments:         Anesthesia Quick Evaluation

## 2020-02-18 NOTE — Op Note (Signed)
[facility] Patient Name: Karla Grant Procedure Date: 02/18/2020 MRN: [id number] Attending MD: Carol Ada , MD Date of Birth: 08-13-1990 CSN: [id number] Age: 29 Admit Type: Outpatient Procedure:                Colonoscopy Indications:              Hematochezia Providers:                Carol Ada, MD, Cleda Daub, RN, Tyrone Apple, Technician, Jefm Miles CRNA Referring MD:              Medicines:                Propofol per Anesthesia Complications:            No immediate complications. Estimated Blood Loss:     Estimated blood loss: none. Procedure:                Pre-Anesthesia Assessment:                           - Prior to the procedure, a History and Physical                            was performed, and patient medications and                            allergies were reviewed. The patient's tolerance of                            previous anesthesia was also reviewed. The risks                            and benefits of the procedure and the sedation                            options and risks were discussed with the patient.                            All questions were answered, and informed consent                            was obtained. Prior Anticoagulants: The patient has                            taken no previous anticoagulant or antiplatelet                            agents. ASA Grade Assessment: III - A patient with                            severe systemic disease. After reviewing the risks                            and  benefits, the patient was deemed in                            satisfactory condition to undergo the procedure.                           - Sedation was administered by an anesthesia                            professional. Deep sedation was attained.                           After obtaining informed consent, the colonoscope                            was passed under direct vision.  Throughout the                            procedure, the patient's blood pressure, pulse, and                            oxygen saturations were monitored continuously. The                            [id number]-[id number] ([id number]) Olympus colonoscope was                            introduced through the anus and advanced to the the                            terminal ileum. The colonoscopy was performed                            without difficulty. The patient tolerated the                            procedure well. The quality of the bowel                            preparation was good. The terminal ileum, ileocecal                            valve, appendiceal orifice, and rectum were                            photographed. Scope In: 1:19:56 PM Scope Out: 1:31:29 PM Scope Withdrawal Time: 0 hours 9 minutes 48 seconds  Total Procedure Duration: 0 hours 11 minutes 33 seconds  Findings:      A 3 mm polyp was found in the transverse colon. The polyp was sessile.       The polyp was removed with a cold snare. Resection and retrieval were       complete. Impression:               - One 3 mm polyp in the transverse colon, removed  with a cold snare. Resected and retrieved. Moderate Sedation:      Not Applicable - Patient had care per Anesthesia. Recommendation:           - Patient has a contact number available for                            emergencies. The signs and symptoms of potential                            delayed complications were discussed with the                            patient. Return to normal activities tomorrow.                            Written discharge instructions were provided to the                            patient.                           - Resume previous diet.                           - Continue present medications.                           - Repeat colonoscopy in 7 years for surveillance. Procedure Code(s):        --- Professional  ---                           [phone], Colonoscopy, flexible; with removal of                            tumor(s), polyp(s), or other lesion(s) by snare                            technique Diagnosis Code(s):        --- Professional ---                           K63.5, Polyp of colon                           K92.1, Melena (includes Hematochezia) CPT copyright 2019 American Medical Association. All rights reserved. The codes documented in this report are preliminary and upon coder review may  be revised to meet current compliance requirements. Carol Ada, MD Carol Ada, MD 02/18/2020 1:35:51 PM This report has been signed electronically. Number of Addenda: 0

## 2020-02-18 NOTE — Discharge Instructions (Signed)

## 2020-02-21 ENCOUNTER — Encounter (HOSPITAL_COMMUNITY): Payer: Self-pay | Admitting: Gastroenterology

## 2020-02-21 NOTE — Anesthesia Postprocedure Evaluation (Signed)
Anesthesia Post Note  Patient: Karla Grant  Procedure(s) Performed: COLONOSCOPY WITH PROPOFOL (N/A ) POLYPECTOMY     Patient location during evaluation: Endoscopy Anesthesia Type: MAC Level of consciousness: awake Pain management: pain level controlled Vital Signs Assessment: post-procedure vital signs reviewed and stable Respiratory status: spontaneous breathing Cardiovascular status: stable Postop Assessment: no apparent nausea or vomiting Anesthetic complications: no   No complications documented.  Last Vitals:  Vitals:   02/18/20 1340 02/18/20 1350  BP: 111/76 119/85  Pulse: 76 72  Resp: 19 15  Temp: 36.9 C   SpO2: 99% 99%    Last Pain:  Vitals:   02/18/20 1350  TempSrc:   PainSc: 0-No pain                 Huston Foley

## 2020-02-22 LAB — SURGICAL PATHOLOGY

## 2020-03-15 ENCOUNTER — Ambulatory Visit: Payer: 59 | Admitting: Nurse Practitioner

## 2021-07-25 LAB — HM PAP SMEAR

## 2021-09-17 ENCOUNTER — Ambulatory Visit (INDEPENDENT_AMBULATORY_CARE_PROVIDER_SITE_OTHER): Payer: 59 | Admitting: Family Medicine

## 2021-09-17 ENCOUNTER — Encounter: Payer: Self-pay | Admitting: Family Medicine

## 2021-09-17 ENCOUNTER — Other Ambulatory Visit: Payer: Self-pay

## 2021-09-17 VITALS — BP 112/76 | HR 82 | Temp 97.6°F | Ht 61.5 in | Wt 283.0 lb

## 2021-09-17 DIAGNOSIS — Z0000 Encounter for general adult medical examination without abnormal findings: Secondary | ICD-10-CM | POA: Diagnosis not present

## 2021-09-17 DIAGNOSIS — L814 Other melanin hyperpigmentation: Secondary | ICD-10-CM

## 2021-09-17 DIAGNOSIS — F341 Dysthymic disorder: Secondary | ICD-10-CM

## 2021-09-17 DIAGNOSIS — R739 Hyperglycemia, unspecified: Secondary | ICD-10-CM

## 2021-09-17 DIAGNOSIS — R7309 Other abnormal glucose: Secondary | ICD-10-CM | POA: Diagnosis not present

## 2021-09-17 LAB — CBC WITH DIFFERENTIAL/PLATELET
Basophils Absolute: 0.1 10*3/uL (ref 0.0–0.1)
Basophils Relative: 1.3 % (ref 0.0–3.0)
Eosinophils Absolute: 0.2 10*3/uL (ref 0.0–0.7)
Eosinophils Relative: 1.9 % (ref 0.0–5.0)
HCT: 42.3 % (ref 36.0–46.0)
Hemoglobin: 13.9 g/dL (ref 12.0–15.0)
Lymphocytes Relative: 19.7 % (ref 12.0–46.0)
Lymphs Abs: 1.7 10*3/uL (ref 0.7–4.0)
MCHC: 32.9 g/dL (ref 30.0–36.0)
MCV: 86.8 fl (ref 78.0–100.0)
Monocytes Absolute: 0.6 10*3/uL (ref 0.1–1.0)
Monocytes Relative: 6.9 % (ref 3.0–12.0)
Neutro Abs: 6.2 10*3/uL (ref 1.4–7.7)
Neutrophils Relative %: 70.2 % (ref 43.0–77.0)
Platelets: 334 10*3/uL (ref 150.0–400.0)
RBC: 4.87 Mil/uL (ref 3.87–5.11)
RDW: 13.5 % (ref 11.5–15.5)
WBC: 8.8 10*3/uL (ref 4.0–10.5)

## 2021-09-17 LAB — LIPID PANEL
Cholesterol: 176 mg/dL (ref 0–200)
HDL: 49.5 mg/dL (ref >39.00)
LDL Cholesterol: 108 mg/dL — ABNORMAL HIGH (ref 0–99)
NonHDL: 126.87
Total CHOL/HDL Ratio: 4
Triglycerides: 94 mg/dL (ref 0.0–149.0)
VLDL: 18.8 mg/dL (ref 0.0–40.0)

## 2021-09-17 LAB — COMPREHENSIVE METABOLIC PANEL
ALT: 19 U/L (ref 0–35)
AST: 18 U/L (ref 0–37)
Albumin: 3.9 g/dL (ref 3.5–5.2)
Alkaline Phosphatase: 64 U/L (ref 39–117)
BUN: 12 mg/dL (ref 6–23)
CO2: 29 mEq/L (ref 19–32)
Calcium: 9.2 mg/dL (ref 8.4–10.5)
Chloride: 102 mEq/L (ref 96–112)
Creatinine, Ser: 0.73 mg/dL (ref 0.40–1.20)
GFR: 108.33 mL/min (ref >60.00)
Glucose, Bld: 104 mg/dL — ABNORMAL HIGH (ref 70–99)
Potassium: 4.1 mEq/L (ref 3.5–5.1)
Sodium: 137 mEq/L (ref 135–145)
Total Bilirubin: 0.5 mg/dL (ref 0.2–1.2)
Total Protein: 7.1 g/dL (ref 6.0–8.3)

## 2021-09-17 LAB — HEMOGLOBIN A1C: Hgb A1c MFr Bld: 5.3 % (ref 4.6–6.5)

## 2021-09-17 NOTE — Patient Instructions (Addendum)
Call your insurance about semaglutide Larna Daughters   GLP drugs  ?Ask if they cover it for obesity  ?Let us know  ? ?Keep working on [employer] and exercise  ?Wear sun protection  ? ?See dermatology for the freckle on your forehead  ?I like the [facility] on [address]  ? ? ?

## 2021-09-17 NOTE — Assessment & Plan Note (Signed)
Discussed how this problem influences overall health and the risks it imposes  ?Reviewed plan for weight loss with lower calorie diet (via better food choices and also portion control or program like weight watchers) and exercise building up to or more than 30 minutes 5 days per week including some aerobic activity  ? ?Still struggling  ?Was in bariatric clinic , changed mind about surgery  ?Interested in GLP if appropriate  ?Will check with ins re: coverage and get back to [place]  ?

## 2021-09-17 NOTE — Progress Notes (Signed)
? ?Subjective:  ? ? Patient ID: Karla Grant, female    DOB: August 22, 1990, 31 y.o.   MRN: [id number] ? ?This visit occurred during the SARS-CoV-2 public health emergency.  Safety protocols were in place, including screening questions prior to the visit, additional usage of staff PPE, and extensive cleaning of exam room while observing appropriate contact time as indicated for disinfecting solutions.  ? ?HPI ?Here for health maintenance exam and to review chronic medical problems   ? ?Wt Readings from Last 3 Encounters:  ?09/17/21 283 lb (128.4 kg)  ?02/18/20 272 lb (123.4 kg)  ?01/27/20 278 lb 1.6 oz (126.1 kg)  ? ?52.61 kg/m? ? ?In January she gave up sugar/processed foods and drinks  ?Lost no more than 2 lb  ?Now not eating out / fixes food at home  ?Does meal service  ? ?Went to bariatric clinic for surgery - she became afraid and stopped going  ?Was worried about side effect  ?May consider again in the future  ? ?Interested in the semaglutide  ? ?Working at [employer] now, really likes it  ?Has not been here in 3 years  ? ? ?Covid status -had 3 vaccines and no covid  ?Flu vaccine was 02/2021 ?Tdap 12/2012 ? ?Pap 11/19 -then had abd pap and LEEP procedure in sept  ?Will get new IUD this fall with another pap  ?Last one in February was ok  ? ? ?Has a h/o HSV anal infection  ?Was screened for all std ?Has IUD-some irregular bleeding but no regular periods ?May have had a cyst recently  ? ? ?Had a colonoscopy 01/2020 with polypectomy after seeing blood in stool  ?Tissue "around colon" was inflamed  ?Thinks coffee caused it in retrospect  ?Has to avoid a lot of coffee  ? ? ?BP Readings from Last 3 Encounters:  ?09/17/21 112/76  ?02/18/20 119/85  ?01/27/20 110/76  ? ?Pulse Readings from Last 3 Encounters:  ?09/17/21 82  ?02/18/20 72  ?01/27/20 86  ? ?H/o elevated glucose ?Lab Results  ?Component Value Date  ? HGBA1C 4.9 08/18/2018  ? ?She donated blood a year ago  ? ?Father was dx with liver cancer  ?Will go on  transplant list  ?He has diabetes now also ? ?Doing ok without medicine for depression  ?Started going to church ?In a book club  ? ?Patient Active Problem List  ? Diagnosis Date Noted  ? Lactose intolerance 03/11/2017  ? Insomnia 12/28/2012  ? TMJ (temporomandibular joint disorder) 12/02/2011  ? Migraine 11/08/2011  ? Routine general medical examination at a health care facility 07/21/2011  ? Morbid obesity ([place]) 03/09/2010  ? DEPRESSION/ANXIETY 08/16/2009  ? Elevated random blood glucose level 02/10/2009  ? 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  ? ?Past Medical History:  ?Diagnosis Date  ? Anxiety   ? Depression   ? Headache(784.0)   ? Hypoglycemia   ? Obesity   ? ?Past Surgical History:  ?Procedure Laterality Date  ? COLONOSCOPY WITH PROPOFOL N/A 02/18/2020  ? Procedure: COLONOSCOPY WITH PROPOFOL;  Surgeon: Carol Ada, MD;  Location: [facility];  Service: Endoscopy;  Laterality: N/A;  ? POLYPECTOMY  02/18/2020  ? Procedure: POLYPECTOMY;  Surgeon: Carol Ada, MD;  Location: [facility];  Service: Endoscopy;;  ? ?Social History  ? ?Tobacco Use  ? Smoking status: Never  ?  Passive exposure: Past  ? Smokeless tobacco: Never  ?Substance Use Topics  ? Alcohol use: Yes  ?  Alcohol/week: 0.0 standard drinks  ?  Comment:  Occasional  ? Drug use: No  ? ?Family History  ?Problem Relation Age of Onset  ? Liver cancer Father   ? Diabetes Mellitus II Father   ? Diabetes Maternal Grandmother   ? Heart disease Maternal Grandfather   ? Cancer Paternal Grandfather   ?     cancer  ? ?Allergies  ?Allergen Reactions  ? Eletriptan Hydrobromide   ?  REACTION: chest pain  ? Topiramate   ?  REACTION: no help  ? ?Current Outpatient Medications on File Prior to Visit  ?Medication Sig Dispense Refill  ? cetirizine (ZYRTEC) 10 MG tablet Take 10 mg by mouth daily.    ? levonorgestrel (KYLEENA) 19.5 MG IUD 1 each by Intrauterine route once.     ? ?No current facility-administered medications on file prior to visit.  ?  ?Review of Systems   ?Constitutional:  Positive for unexpected weight change. Negative for activity change, appetite change, fatigue and fever.  ?     Frustrated with weight loss effort   ?HENT:  Negative for congestion, ear pain, rhinorrhea, sinus pressure and sore throat.   ?Eyes:  Positive for visual disturbance. Negative for pain and redness.  ?Respiratory:  Negative for cough, shortness of breath and wheezing.   ?Cardiovascular:  Negative for chest pain and palpitations.  ?Gastrointestinal:  Negative for abdominal pain, blood in stool, constipation and diarrhea.  ?Endocrine: Negative for polydipsia and polyuria.  ?Genitourinary:  Negative for dysuria, frequency and urgency.  ?Musculoskeletal:  Negative for arthralgias, back pain and myalgias.  ?Skin:  Negative for pallor and rash.  ?Allergic/Immunologic: Negative for environmental allergies.  ?Neurological:  Negative for dizziness, syncope and headaches.  ?Hematological:  Negative for adenopathy. Does not bruise/bleed easily.  ?Psychiatric/Behavioral:  Negative for decreased concentration and dysphoric mood. The patient is not nervous/anxious.   ? ?   ?Objective:  ? Physical Exam ?Constitutional:   ?   General: She is not in acute distress. ?   Appearance: Normal appearance. She is well-developed. She is obese. She is not ill-appearing or diaphoretic.  ?HENT:  ?   Head: Normocephalic and atraumatic.  ?   Right Ear: Tympanic membrane, ear canal and external ear normal.  ?   Left Ear: Tympanic membrane, ear canal and external ear normal.  ?   Nose: Nose normal. No congestion.  ?   Mouth/Throat:  ?   Mouth: Mucous membranes are moist.  ?   Pharynx: Oropharynx is clear. No posterior oropharyngeal erythema.  ?Eyes:  ?   General: No scleral icterus. ?   Extraocular Movements: Extraocular movements intact.  ?   Conjunctiva/sclera: Conjunctivae normal.  ?   Pupils: Pupils are equal, round, and reactive to light.  ?Neck:  ?   Thyroid: No thyromegaly.  ?   Vascular: No carotid bruit or JVD.   ?Cardiovascular:  ?   Rate and Rhythm: Normal rate and regular rhythm.  ?   Pulses: Normal pulses.  ?   Heart sounds: Normal heart sounds.  ?  No gallop.  ?Pulmonary:  ?   Effort: Pulmonary effort is normal. No respiratory distress.  ?   Breath sounds: Normal breath sounds. No wheezing.  ?   Comments: Good air exch ?Chest:  ?   Chest wall: No tenderness.  ?Abdominal:  ?   General: Bowel sounds are normal. There is no distension or abdominal bruit.  ?   Palpations: Abdomen is soft. There is no mass.  ?   Tenderness: There is no abdominal tenderness.  ?  Hernia: No hernia is present.  ?Genitourinary: ?   Comments: Breast and pelvic exams are done by gyn provider  ?Musculoskeletal:     ?   General: No tenderness. Normal range of motion.  ?   Cervical back: Normal range of motion and neck supple. No rigidity. No muscular tenderness.  ?   Right lower leg: No edema.  ?   Left lower leg: No edema.  ?   Comments: No kyphosis   ?Lymphadenopathy:  ?   Cervical: No cervical adenopathy.  ?Skin: ?   General: Skin is warm and dry.  ?   Coloration: Skin is not pale.  ?   Findings: No erythema or rash.  ?   Comments: Fair  ?Solar lentigines diffusely ?  ?Neurological:  ?   Mental Status: She is alert. Mental status is at baseline.  ?   Cranial Nerves: No cranial nerve deficit.  ?   Motor: No abnormal muscle tone.  ?   Coordination: Coordination normal.  ?   Gait: Gait normal.  ?   Deep Tendon Reflexes: Reflexes are normal and symmetric. Reflexes normal.  ?Psychiatric:     ?   Mood and Affect: Mood normal.     ?   Cognition and Memory: Cognition and memory normal.  ? ? ? ? ? ?   ?Assessment & Plan:  ? ?Problem List Items Addressed This Visit   ? ?  ? Other  ? DEPRESSION/ANXIETY  ? Elevated random blood glucose level  ? Relevant Orders  ? Hemoglobin A1c  ? Lentigo  ?  New 3 mm brown lentigo on R forehead ?Pt desires derm attn since new ?She will call for appt  ?Alert if needs referral  ?Counseled on skin cancer prevention  ?  ?  ?  Morbid obesity ([place])  ?  Discussed how this problem influences overall health and the risks it imposes  ?Reviewed plan for weight loss with lower calorie diet (via better food choices and also portion control or progra

## 2021-09-17 NOTE — Assessment & Plan Note (Signed)
New 3 mm brown lentigo on R forehead ?Pt desires derm attn since new ?She will call for appt  ?Alert if needs referral  ?Counseled on skin cancer prevention  ?

## 2021-09-17 NOTE — Assessment & Plan Note (Signed)
Reviewed health habits including diet and exercise and skin cancer prevention ?Reviewed appropriate screening tests for age  ?Also reviewed health mt list, fam hx and immunization status , as well as social and family history   ?See HPI ?Labs ordered ?Disc wt loss efforts  ?Is covid vaccinated  ?Pap in feb/followed after LEEP by gyn  ?Encouraged self breast exams ?Has had a colonoscopy (dx)with polyp in 2021 ? ? ?

## 2021-09-18 LAB — TSH: TSH: 2.33 u[IU]/mL (ref 0.35–5.50)

## 2021-10-15 ENCOUNTER — Encounter: Payer: Self-pay | Admitting: Family Medicine

## 2021-10-16 NOTE — Telephone Encounter (Signed)
Patient called to follow up on this note. Please advise ?

## 2021-10-24 NOTE — Telephone Encounter (Signed)
Sent mychart message and also left VM letting pt know PCP wants to see her for a virtual visit to discuss letter and to document everything correctly  ?

## 2021-10-29 ENCOUNTER — Ambulatory Visit: Payer: 59 | Admitting: Family Medicine

## 2021-10-29 ENCOUNTER — Encounter: Payer: Self-pay | Admitting: Family Medicine

## 2021-10-29 VITALS — BP 112/78 | HR 89 | Ht 61.5 in | Wt 287.0 lb

## 2021-10-29 DIAGNOSIS — F341 Dysthymic disorder: Secondary | ICD-10-CM | POA: Diagnosis not present

## 2021-10-29 MED ORDER — SEMAGLUTIDE-WEIGHT MANAGEMENT 0.25 MG/0.5ML ~~LOC~~ SOAJ: 0.2500 mg | SUBCUTANEOUS | 1 refills | Status: DC

## 2021-10-29 NOTE — Patient Instructions (Addendum)
I will send px for generic Karla Grant so I can get a prior auth from the pharmacy /insurance co  ? ?Take care of yourself ?Try and fit in exercise  ? ?If depression or anxiety worsen please let us know  ? ? ? ? ? ? ? ? ? ? ?

## 2021-10-29 NOTE — Assessment & Plan Note (Addendum)
Doing ok  ?Instead of medication (in the past took paxil, zoloft,lexapro, trazodone and prozac) she now has support animals  ?Reviewed stressors/ coping techniques/symptoms/ support sources/ tx options and side effects in detail today ? ?A small dog for depression (Toby) who is cheerful ?A larger dog for anxiety Karla Grant) who is calm and makes her feel safe (very protective) ?This has made a big difference for her  ?Has had counseling in the past but not lately  ?Making friends and socializing much more with the women in her life  ?Encouraged her strongly to fit in exercise and self care ? ?Letters written today to allow her to have dogs in her apartment  ? ?60   Minutes were spent today both face to face and in the chart obtaining history, reviewing records and med side effects , educating and discussing treatment options, and writing 2 letters. ? ? ? ?  ?

## 2021-10-29 NOTE — Progress Notes (Signed)
? ?Subjective:  ? ? Patient ID: Karla Grant, female    DOB: 07/05/1990, 31 y.o.   MRN: [id number] ? ?HPI ?Pt presents for f/u of depression/anxiety  ?And obesity  ? ? ?Wt Readings from Last 3 Encounters:  ?10/29/21 287 lb (130.2 kg)  ?09/17/21 283 lb (128.4 kg)  ?02/18/20 272 lb (123.4 kg)  ? ?53.35 kg/m? ? ? ?Has 2 emotional support animals and needs separate letters for her apt complex  ?Moving into a new place/part of the fair housing act  ? ?They do not charge to deposit if she verifies that she has support animal  ? ?Current treatment :no medications  ? ?Needs to est a connection for need of each animals  ? ? ?Has taken lexapro and sertraline and paxil and wellbutrin in the past  ?Last took fluoxetine and trazadone ? ?Anxiety has been a bit better -the pets help  ?Reading [place] of the name  ?In a book club and bible study group  ?Going to church on Sunday and has a connection there  ?Thinks this has made a difference  ?Co workers help  ?Faith helps  ?It is good to talk to someone besides her mother  ?Deals better with women than men ? ?Depression :  ?Still quite a bit  ?Is hard to deal with this when worrying about her parents ?Father has liver cancer and cirrhosis  he has to have a transplant  ?Mother is struggling after some surgery  ?Gets down when she starts thinking about it  ?Does not have the energy to get up and clean the house  ?Forcing herself to cook meals as well  (does have delivery)  ? ?Overall feels like she does ok off of medications  ?Also they were expensive and not worth the $  ?Never had a drastic change  ? ? ?Exercise /self care :  ?Needs to start doing more exercise  ?10 hour days are long and it is hard to go to the gym after  ? ?She tried to start counseling in December and it never worked out in terms of schedule  ?Her last counselor retired -was helpful  ?Still uses those skills  ? ? ?First dog: Toby- small mixed breed  ?Helps her mood /makes her happy and feels needed  ?For  depression / cheerful  ? ?2nd dog: Luna  ?Helps with her anxiety  ?More calm and offers more protection because she is bigger  ? ?Obesity  ?No underlying conditions  ?Mancel Parsons may be covered  ? ?Patient Active Problem List  ? Diagnosis Date Noted  ? Lentigo 09/17/2021  ? Lactose intolerance 03/11/2017  ? Insomnia 12/28/2012  ? TMJ (temporomandibular joint disorder) 12/02/2011  ? Migraine 11/08/2011  ? Routine general medical examination at a health care facility 07/21/2011  ? Morbid obesity ([place]) 03/09/2010  ? DEPRESSION/ANXIETY 08/16/2009  ? Elevated random blood glucose level 02/10/2009  ? 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  ? ?Past Medical History:  ?Diagnosis Date  ? Anxiety   ? Depression   ? Headache(784.0)   ? Hypoglycemia   ? Obesity   ? ?Past Surgical History:  ?Procedure Laterality Date  ? COLONOSCOPY WITH PROPOFOL N/A 02/18/2020  ? Procedure: COLONOSCOPY WITH PROPOFOL;  Surgeon: Carol Ada, MD;  Location: [facility];  Service: Endoscopy;  Laterality: N/A;  ? POLYPECTOMY  02/18/2020  ? Procedure: POLYPECTOMY;  Surgeon: Carol Ada, MD;  Location: [facility];  Service: Endoscopy;;  ? ?Social History  ? ?Tobacco Use  ? Smoking status:  Never  ?  Passive exposure: Past  ? Smokeless tobacco: Never  ?Vaping Use  ? Vaping Use: Never used  ?Substance Use Topics  ? Alcohol use: Yes  ?  Alcohol/week: 0.0 standard drinks  ?  Comment: Occasional  ? Drug use: No  ? ?Family History  ?Problem Relation Age of Onset  ? Liver cancer Father   ? Diabetes Mellitus II Father   ? Diabetes Maternal Grandmother   ? Heart disease Maternal Grandfather   ? Cancer Paternal Grandfather   ?     cancer  ? ?Allergies  ?Allergen Reactions  ? Eletriptan Hydrobromide   ?  REACTION: chest pain  ? Topiramate   ?  REACTION: no help  ? ?Current Outpatient Medications on File Prior to Visit  ?Medication Sig Dispense Refill  ? cetirizine (ZYRTEC) 10 MG tablet Take 10 mg by mouth daily.    ? levonorgestrel (KYLEENA) 19.5 MG IUD 1 each by Intrauterine  route once.     ? ?No current facility-administered medications on file prior to visit.  ?  ?Review of Systems  ?Constitutional:  Positive for fatigue. Negative for activity change, appetite change, fever and unexpected weight change.  ?HENT:  Negative for congestion, ear pain, rhinorrhea, sinus pressure and sore throat.   ?Eyes:  Negative for pain, redness and visual disturbance.  ?Respiratory:  Negative for cough, shortness of breath and wheezing.   ?Cardiovascular:  Negative for chest pain and palpitations.  ?Gastrointestinal:  Negative for abdominal pain, blood in stool, constipation and diarrhea.  ?Endocrine: Negative for polydipsia and polyuria.  ?Genitourinary:  Negative for dysuria, frequency and urgency.  ?Musculoskeletal:  Negative for arthralgias, back pain and myalgias.  ?Skin:  Negative for pallor and rash.  ?Allergic/Immunologic: Negative for environmental allergies.  ?Neurological:  Negative for dizziness, syncope and headaches.  ?Hematological:  Negative for adenopathy. Does not bruise/bleed easily.  ?Psychiatric/Behavioral:  Positive for dysphoric mood. Negative for decreased concentration, self-injury and suicidal ideas. The patient is nervous/anxious.   ? ?   ?Objective:  ? Physical Exam ?Constitutional:   ?   General: She is not in acute distress. ?   Appearance: Normal appearance. She is obese. She is not ill-appearing or diaphoretic.  ?Eyes:  ?   General: No scleral icterus.    ?   Right eye: No discharge.     ?   Left eye: No discharge.  ?   Conjunctiva/sclera: Conjunctivae normal.  ?   Pupils: Pupils are equal, round, and reactive to light.  ?Cardiovascular:  ?   Rate and Rhythm: Normal rate.  ?Pulmonary:  ?   Effort: Pulmonary effort is normal. No respiratory distress.  ?Musculoskeletal:  ?   Cervical back: Normal range of motion.  ?Lymphadenopathy:  ?   Cervical: No cervical adenopathy.  ?Skin: ?   General: Skin is warm and dry.  ?Neurological:  ?   Mental Status: She is alert.   ?Psychiatric:     ?   Attention and Perception: Attention normal.     ?   Mood and Affect: Mood is anxious.     ?   Speech: Speech normal.     ?   Behavior: Behavior normal.     ?   Thought Content: Thought content is not paranoid or delusional.     ?   Cognition and Memory: Cognition and memory normal.  ?   Comments: Mildly anxious  ?Pleasant  ? ?Candidly discusses symptoms and stressors  ?  ? ? ? ? ? ?   ?  Assessment & Plan:  ? ?Problem List Items Addressed This Visit   ? ?  ? Other  ? DEPRESSION/ANXIETY - Primary  ?  Doing ok  ?Instead of medication (in the past took paxil, zoloft,lexapro, trazodone and prozac) she now has support animals  ?Reviewed stressors/ coping techniques/symptoms/ support sources/ tx options and side effects in detail today ? ?A small dog for depression (Toby) who is cheerful ?A larger dog for anxiety Wynonia Musty) who is calm and makes her feel safe (very protective) ?This has made a big difference for her  ?Has had counseling in the past but not lately  ?Making friends and socializing much more with the women in her life  ?Encouraged her strongly to fit in exercise and self care ? ?Letters written today to allow her to have dogs in her apartment  ? ?16   Minutes were spent today both face to face and in the chart obtaining history, reviewing records and med side effects , educating and discussing treatment options, and writing 2 letters. ? ? ? ?  ?  ?  ? Morbid obesity ([place])  ?  Interested in GLP medication  ?Found out her ins may cover wegovy if a prior Josem Kaufmann is done ? ?Px sent to pharmacy ?Will do prior auth when it arrives  ?I think she may benefit greatly from this as she has failed so many other efforts and is apprehensive about surgery  ? ?  ?  ? Relevant Medications  ? Semaglutide-Weight Management 0.25 MG/0.5ML SOAJ  ? ? ? ? ?

## 2021-10-29 NOTE — Assessment & Plan Note (Signed)
Interested in GLP medication  ?Found out her ins may cover wegovy if a prior Karla Grant is done ? ?Px sent to pharmacy ?Will do prior auth when it arrives  ?I think she may benefit greatly from this as she has failed so many other efforts and is apprehensive about surgery  ?

## 2022-04-24 ENCOUNTER — Encounter: Payer: Self-pay | Admitting: Family Medicine

## 2022-04-24 ENCOUNTER — Ambulatory Visit: Payer: 59 | Admitting: Family Medicine

## 2022-04-24 VITALS — BP 118/76 | HR 91 | Temp 97.7°F | Ht 61.5 in | Wt 281.2 lb

## 2022-04-24 DIAGNOSIS — F341 Dysthymic disorder: Secondary | ICD-10-CM

## 2022-04-24 DIAGNOSIS — Z23 Encounter for immunization: Secondary | ICD-10-CM

## 2022-04-24 MED ORDER — SEMAGLUTIDE-WEIGHT MANAGEMENT 0.25 MG/0.5ML ~~LOC~~ SOAJ: 0.2500 mg | SUBCUTANEOUS | 1 refills | Status: DC

## 2022-04-24 MED ORDER — SERTRALINE HCL 50 MG PO TABS: 50.0000 mg | ORAL_TABLET | Freq: Every day | ORAL | 1 refills | Status: DC

## 2022-04-24 NOTE — Progress Notes (Signed)
   Subjective:    Patient ID: Karla Grant, female    DOB: 17-Nov-1990, 31 y.o.   MRN: [id number]  HPI Pt presents for follow up of anxiety  L ankle pain    Wt Readings from Last 3 Encounters:  04/24/22 281 lb 4 oz (127.6 kg)  10/29/21 287 lb (130.2 kg)  09/17/21 283 lb (128.4 kg)   52.28 kg/m  F/u for anxiety and depression   Anxiety has been worse/ coming back  Has persistent thoughts of "worse case scenerio" More frequent  Ruminates on the bad thoughts   Tries to re direct thoughts  Forces positive thoughts when she can   Self care :  Enjoys her dogs  Nails done every 3-4 weeks  Outdoors  Needs to walk more : goal of 15 min per night, plus more with her dogs  Keeps up with household tasks well  Has access to walking videos on line  Has sidewalks in her new apt complex so can get out   Not in counseling right now  Would like to have a referral   Also gets some depression when days get short  Tries to keep her lights on   Sleep is fair  At times good and then bad Hard time falling asleep   Appetite is ok  Trying to eat out less and eat healthier  Leaning how to cook     Father had liver transplant in aug and doing very well  Sister is doing well , is trying to plan a pregnancy and worries about it   Saw psychiatry in the past  Ins did not cover   PHQ-9 score of 6 GAD of 12    In past took  Paxil Zoloft Lexapro Trazodone- did not help and could not swallow  Gabapentin -for headaches but helped anxiety  Prozac   Has support animals   Review of Systems     Objective:   Physical Exam        Assessment & Plan:

## 2022-04-24 NOTE — Assessment & Plan Note (Signed)
Has lost some weight with better habits  Did not get last px of semaglutide- so sent again  If not in stock will have to wait

## 2022-04-24 NOTE — Patient Instructions (Addendum)
I will place a referral for counseling with [facility] You will get a call  If you don't hear in 1-2 weeks let us know    Get back to walking Get outdoors when you can  Use weights/bands for strength training  Consider zumba - out or at home   Exercise really helps mood !   Make sure you have very supportive well fitting athletic shoes for exercise  New balance are great   Ice your ankle and try a compression wrap as needed   Start zoloft 50 mg in the evening every day  If any intolerable side effects or if you feel worse (more anxious or depressed or suicidal) hold it and let us know   Follow up in 4-6 weeks

## 2022-04-24 NOTE — Assessment & Plan Note (Signed)
More worry lately about health of family members Struggles with obtrusive negative thoughts/worry  occ sleep problems  Reviewed stressors/ coping techniques/symptoms/ support sources/ tx options and side effects in detail today Self care is fair  Interested in counseling-referral done Interested in medication, will try zoloft which worked well for her in the past (past 100 mg) Will start with 50 mg daily in eve F/u 4-6 wk  Enc self care- esp exercise and outdoor time Push to socialize when able also  Discussed expectations of SSRI medication including time to effectiveness and mechanism of action, also poss of side effects (early and late)- including mental fuzziness, weight or appetite change, nausea and poss of worse dep or anxiety (even suicidal thoughts)  Pt voiced understanding and will stop med and update if this occurs

## 2022-06-03 ENCOUNTER — Encounter: Payer: Self-pay | Admitting: Family Medicine

## 2022-06-03 ENCOUNTER — Ambulatory Visit (INDEPENDENT_AMBULATORY_CARE_PROVIDER_SITE_OTHER): Payer: 59 | Admitting: Family Medicine

## 2022-06-03 VITALS — BP 112/70 | HR 52 | Temp 97.1°F | Ht 61.5 in | Wt 278.0 lb

## 2022-06-03 DIAGNOSIS — F341 Dysthymic disorder: Secondary | ICD-10-CM

## 2022-06-03 NOTE — Progress Notes (Signed)
Subjective:    Patient ID: Karla Grant, female    DOB: 05-09-1991, 31 y.o.   MRN: [id number]  HPI Pt presents for f/u of mood/anxiety   Wt Readings from Last 3 Encounters:  06/03/22 278 lb (126.1 kg)  04/24/22 281 lb 4 oz (127.6 kg)  10/29/21 287 lb (130.2 kg)   51.68 kg/m Lost some wt  Not eating out as much   Last visit discussed increased anxiety symptoms  Started sertraline 50 mg daily with plan for self care and referral done for counseling  Went through a funk around thanksgiving (even left work one day early)  Did not go to activities that weekend  Not suicidal at all  Not sleeping great  More emotional lately - the funk is better / the ups and downs are leveling out   Anxiety is improved significantly  Has appt on 8th with therapist   Wants to give this medicine a little more time   Sister is having a baby- very excited about that  Still worries about her   Her father is doing better but still worries about that a bit   Makes an effort to get outside more  Walking her dogs for exercise  Nothing additional   Patient Active Problem List   Diagnosis Date Noted   Lentigo 09/17/2021   Lactose intolerance 03/11/2017   Insomnia 12/28/2012   TMJ (temporomandibular joint disorder) 12/02/2011   Migraine 11/08/2011   Routine general medical examination at a health care facility 07/21/2011   Morbid obesity ([place]) 03/09/2010   DEPRESSION/ANXIETY 08/16/2009   Elevated random blood glucose level 02/10/2009   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   Past Medical History:  Diagnosis Date   Anxiety    Depression    Headache(784.0)    Hypoglycemia    Obesity    Past Surgical History:  Procedure Laterality Date   COLONOSCOPY WITH PROPOFOL N/A 02/18/2020   Procedure: COLONOSCOPY WITH PROPOFOL;  Surgeon: Carol Ada, MD;  Location: [facility];  Service: Endoscopy;  Laterality: N/A;   POLYPECTOMY  02/18/2020   Procedure: POLYPECTOMY;  Surgeon: Carol Ada, MD;  Location: [facility];  Service: Endoscopy;;   Social History   Tobacco Use   Smoking status: Never    Passive exposure: Past   Smokeless tobacco: Never  Vaping Use   Vaping Use: Never used  Substance Use Topics   Alcohol use: Yes    Alcohol/week: 0.0 standard drinks of alcohol    Comment: Occasional   Drug use: No   Family History  Problem Relation Age of Onset   Liver cancer Father    Diabetes Mellitus II Father    Diabetes Maternal Grandmother    Heart disease Maternal Grandfather    Cancer Paternal Grandfather        cancer   Allergies  Allergen Reactions   Eletriptan Hydrobromide     REACTION: chest pain   Topiramate     REACTION: no help   Current Outpatient Medications on File Prior to Visit  Medication Sig Dispense Refill   cetirizine (ZYRTEC) 10 MG tablet Take 10 mg by mouth daily.     levonorgestrel (KYLEENA) 19.5 MG IUD 1 each by Intrauterine route once.      sertraline (ZOLOFT) 50 MG tablet Take 1 tablet (50 mg total) by mouth at bedtime. 90 tablet 1   Semaglutide-Weight Management 0.25 MG/0.5ML SOAJ Inject 0.25 mg into the skin once a week. (Patient not taking: Reported on 06/03/2022) 3 mL 1  No current facility-administered medications on file prior to visit.      Review of Systems  Constitutional:  Positive for fatigue. Negative for activity change, appetite change, fever and unexpected weight change.  HENT:  Negative for congestion, ear pain, rhinorrhea, sinus pressure and sore throat.   Eyes:  Negative for pain, redness and visual disturbance.  Respiratory:  Negative for cough, shortness of breath and wheezing.   Cardiovascular:  Negative for chest pain and palpitations.  Gastrointestinal:  Negative for abdominal pain, blood in stool, constipation and diarrhea.  Endocrine: Negative for polydipsia and polyuria.  Genitourinary:  Negative for dysuria, frequency and urgency.  Musculoskeletal:  Negative for arthralgias, back pain and myalgias.  Skin:  Negative  for pallor and rash.  Allergic/Immunologic: Negative for environmental allergies.  Neurological:  Negative for dizziness, syncope and headaches.  Hematological:  Negative for adenopathy. Does not bruise/bleed easily.  Psychiatric/Behavioral:  Positive for dysphoric mood and sleep disturbance. Negative for decreased concentration. The patient is nervous/anxious.        Objective:   Physical Exam Constitutional:      General: She is not in acute distress.    Appearance: Normal appearance. She is well-developed. She is obese. She is not ill-appearing or diaphoretic.  HENT:     Head: Normocephalic and atraumatic.  Eyes:     Conjunctiva/sclera: Conjunctivae normal.     Pupils: Pupils are equal, round, and reactive to light.  Neck:     Thyroid: No thyromegaly.     Vascular: No carotid bruit or JVD.  Cardiovascular:     Rate and Rhythm: Normal rate and regular rhythm.     Heart sounds: Normal heart sounds.     No gallop.  Pulmonary:     Effort: Pulmonary effort is normal. No respiratory distress.     Breath sounds: Normal breath sounds. No wheezing or rales.  Abdominal:     General: There is no distension or abdominal bruit.     Palpations: Abdomen is soft.  Musculoskeletal:     Cervical back: Normal range of motion and neck supple.     Right lower leg: No edema.     Left lower leg: No edema.  Lymphadenopathy:     Cervical: No cervical adenopathy.  Skin:    General: Skin is warm and dry.     Coloration: Skin is not pale.     Findings: No rash.  Neurological:     Mental Status: She is alert.     Cranial Nerves: No cranial nerve deficit.     Coordination: Coordination normal.     Deep Tendon Reflexes: Reflexes are normal and symmetric. Reflexes normal.  Psychiatric:        Attention and Perception: Attention normal.        Mood and Affect: Mood normal. Affect is not blunt or flat.        Behavior: Behavior normal.        Thought Content: Thought content is not paranoid or  delusional. Thought content does not include suicidal ideation.        Cognition and Memory: Cognition and memory normal.     Comments: Less anxious today  Pleasant Candidly discusses symptoms and stressors             Assessment & Plan:   Problem List Items Addressed This Visit       Other   DEPRESSION/ANXIETY - Primary    On sertraline 50 mg daily  Anxiety is improving  Depression was  worse initially and this seems to be leveling out  Pt would like to give is more time (since she has failed so many other meds in the past) Planning her first counseling appt  Working on self care  Reviewed stressors/ coping techniques/symptoms/ support sources/ tx options and side effects in detail today No SI today  Will plan to continue with this and f/u in 4-6 wk

## 2022-06-03 NOTE — Patient Instructions (Addendum)
Take a look at some exercise programs on line and see what you may like  [employer] has a lot of stuff   Walk and get outside when you can also   Continue the zoloft 50 mg daily  If your depression gets worse - let us know  Follow up in 4-6 weeks   Start counseling as planned

## 2022-06-03 NOTE — Assessment & Plan Note (Signed)
On sertraline 50 mg daily  Anxiety is improving  Depression was worse initially and this seems to be leveling out  Pt would like to give is more time (since she has failed so many other meds in the past) Planning her first counseling appt  Working on self care  Reviewed stressors/ coping techniques/symptoms/ support sources/ tx options and side effects in detail today No SI today  Will plan to continue with this and f/u in 4-6 wk

## 2022-07-01 ENCOUNTER — Ambulatory Visit: Payer: 59 | Admitting: Behavioral Health

## 2022-07-08 ENCOUNTER — Encounter: Payer: Self-pay | Admitting: Family Medicine

## 2022-07-08 ENCOUNTER — Ambulatory Visit (INDEPENDENT_AMBULATORY_CARE_PROVIDER_SITE_OTHER): Payer: 59 | Admitting: Family Medicine

## 2022-07-08 VITALS — BP 114/68 | HR 86 | Temp 97.5°F | Ht 61.5 in | Wt 279.4 lb

## 2022-07-08 DIAGNOSIS — F341 Dysthymic disorder: Secondary | ICD-10-CM | POA: Diagnosis not present

## 2022-07-08 MED ORDER — SERTRALINE HCL 50 MG PO TABS: 75.0000 mg | ORAL_TABLET | Freq: Every day | ORAL | 1 refills | Status: DC

## 2022-07-08 NOTE — Patient Instructions (Addendum)
Think about options for exercise  Exercise is so important for treatment of anxiety !  Moving makes you feel better   Look at programs on u tube  Start with 10-15 minutes per day to get into a routine Then gradually advance to 30 or more minutes as you can   Bundle up and get out and walk your dogs   Call your insurance regarding the newer weight loss medicine If it is available and your insurance covers it we can try that   For better sleep- try to cut back on caffeine   Go up on the sertraline from 50 to 75 mg daily   (You can take 1 1/2 pill of the 50 mg once daily)   Start back with counseling   Try to get most of your carbohydrates from produce (with the exception of white potatoes)  Eat less bread/pasta/rice/snack foods/cereals/sweets and other items from the middle of the grocery store (processed carbs)  Schedule your annual exam for April

## 2022-07-08 NOTE — Assessment & Plan Note (Signed)
Overall some improvement with sertraline 50 mg daily  Had to re schedule counseling due to provider conflict  This is re scheduled later this month   Interested in inc sertraine to 75 mg daily  Disc poss side eff Will update  F/u April for annual exam Disc imp of exercise and other self care as well

## 2022-07-08 NOTE — Progress Notes (Signed)
Subjective:    Patient ID: Karla Grant, female    DOB: 10/06/1990, 32 y.o.   MRN: [id number]  HPI Pt presents for f/u of depression with anxiety   Wt Readings from Last 3 Encounters:  07/08/22 279 lb 6 oz (126.7 kg)  06/03/22 278 lb (126.1 kg)  04/24/22 281 lb 4 oz (127.6 kg)   51.93 kg/m  Vitals:   07/08/22 1129  BP: 114/68  Pulse: 86  Temp: (!) 97.5 F (36.4 C)  TempSrc: Temporal  SpO2: 97%  Weight: 279 lb 6 oz (126.7 kg)  Height: 5' 1.5" (1.562 m)    Gained weight over the holidays Eating sweets - rice crispy treats  Does a little cooking in her apartment   Getting used to some lean frozen meals   Struggles with produce- dislikes most fruit and veg  Did buy some apples  Some salads   Anxiety/depression  Doing well/ better   No longer flirting with depression as much  Anxiety is pretty well controlled   Learning how to deal with intrusive thoughts  Still worries about her sister who is pregnant   On sertraline 50 mg daily    Counseling : her counselor had to cancel her last appt so needs to re schedule Is on [facility] [place] -has it scheduled on 1/24   Taking to her mom and dad more lately  Has a good relationship  Has a friend also she talks to   No exercise besides taking dogs out  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)   Plays games on her phone     Past medications Paxil, zoloft, lexapro, trazodone, prozac   Still cannot get wegovy due to no supply  Would zepbound be an optoin   Patient Active Problem List   Diagnosis Date Noted   Lentigo 09/17/2021   Lactose intolerance 03/11/2017   Insomnia 12/28/2012   TMJ (temporomandibular joint disorder) 12/02/2011   Migraine 11/08/2011   Routine general medical examination at a health care facility 07/21/2011   Morbid obesity ([place]) 03/09/2010   DEPRESSION/ANXIETY 08/16/2009   Elevated random blood glucose level 02/10/2009   ACNE ROSACEA  01/13/2009   Past Medical History:  Diagnosis Date   Anxiety    Depression    Headache(784.0)    Hypoglycemia    Obesity    Past Surgical History:  Procedure Laterality Date   COLONOSCOPY WITH PROPOFOL N/A 02/18/2020   Procedure: COLONOSCOPY WITH PROPOFOL;  Surgeon: Carol Ada, MD;  [facility];  Service: Endoscopy;  Laterality: N/A;   POLYPECTOMY  02/18/2020   Procedure: POLYPECTOMY;  Surgeon: Carol Ada, MD;  Location[facility];  Service: Endoscopy;;   Social History   Tobacco Use   Smoking status: Never    Passive exposure: Past   Smokeless tobacco: Never  Vaping Use   Vaping Use: Never used  Substance Use Topics   Alcohol use: Yes    Alcohol/week: 0.0 standard drinks of alcohol    Comment: Occasional   Drug use: No   Family History  Problem Relation Age of Onset   Liver cancer Father    Diabetes Mellitus II Father    Diabetes Maternal Grandmother    Heart disease Maternal Grandfather    Cancer Paternal Grandfather        cancer   Allergies  Allergen Reactions   Eletriptan Hydrobromide     REACTION: chest pain   Topiramate  REACTION: no help   Current Outpatient Medications on File Prior to Visit  Medication Sig Dispense Refill   cetirizine (ZYRTEC) 10 MG tablet Take 10 mg by mouth daily.     levonorgestrel (KYLEENA) 19.5 MG IUD 1 each by Intrauterine route once.      Semaglutide-Weight Management 0.25 MG/0.5ML SOAJ Inject 0.25 mg into the skin once a week. (Patient not taking: Reported on 07/08/2022) 3 mL 1   No current facility-administered medications on file prior to visit.    Review of Systems  Constitutional:  Positive for fatigue. Negative for activity change, appetite change, fever and unexpected weight change.  HENT:  Negative for congestion, ear pain, rhinorrhea, sinus pressure and sore throat.   Eyes:  Negative for pain, redness and visual disturbance.  Respiratory:  Negative for cough, shortness of breath and wheezing.    Cardiovascular:  Negative for chest pain and palpitations.  Gastrointestinal:  Negative for abdominal pain, blood in stool, constipation and diarrhea.  Endocrine: Negative for polydipsia and polyuria.  Genitourinary:  Negative for dysuria, frequency and urgency.  Musculoskeletal:  Negative for arthralgias, back pain and myalgias.  Skin:  Negative for pallor and rash.  Allergic/Immunologic: Negative for environmental allergies.  Neurological:  Negative for dizziness, syncope and headaches.  Hematological:  Negative for adenopathy. Does not bruise/bleed easily.  Psychiatric/Behavioral:  Positive for dysphoric mood and sleep disturbance. Negative for decreased concentration, self-injury and suicidal ideas. The patient is nervous/anxious.        Objective:   Physical Exam Constitutional:      General: She is not in acute distress.    Appearance: Normal appearance. She is well-developed. She is obese. She is not ill-appearing or diaphoretic.  HENT:     Head: Normocephalic and atraumatic.     Mouth/Throat:     Mouth: Mucous membranes are moist.  Eyes:     General: No scleral icterus.    Conjunctiva/sclera: Conjunctivae normal.     Pupils: Pupils are equal, round, and reactive to light.  Neck:     Thyroid: No thyromegaly.     Vascular: No carotid bruit or JVD.  Cardiovascular:     Rate and Rhythm: Normal rate and regular rhythm.     Heart sounds: Normal heart sounds.     No gallop.  Pulmonary:     Effort: Pulmonary effort is normal. No respiratory distress.     Breath sounds: Normal breath sounds. No wheezing or rales.  Abdominal:     General: There is no distension or abdominal bruit.     Palpations: Abdomen is soft.  Musculoskeletal:     Cervical back: Normal range of motion and neck supple.     Right lower leg: No edema.     Left lower leg: No edema.  Lymphadenopathy:     Cervical: No cervical adenopathy.  Skin:    General: Skin is warm and dry.     Coloration: Skin is  not pale.     Findings: No rash.  Neurological:     Mental Status: She is alert.     Coordination: Coordination normal.     Deep Tendon Reflexes: Reflexes are normal and symmetric. Reflexes normal.     Comments: No tremor   Psychiatric:        Attention and Perception: Attention normal.        Mood and Affect: Mood is anxious.        Speech: Speech normal.        Behavior: Behavior normal.  Thought Content: Thought content does not include suicidal ideation.        Cognition and Memory: Cognition and memory normal.     Comments: Candidly discusses symptoms and stressors   Not tearful or distressed Less anxious today           Assessment & Plan:   Problem List Items Addressed This Visit       Other   DEPRESSION/ANXIETY - Primary    Overall some improvement with sertraline 50 mg daily  Had to re schedule counseling due to provider conflict  This is re scheduled later this month   Interested in inc sertraine to 75 mg daily  Disc poss side eff Will update  F/u April for annual exam Disc imp of exercise and other self care as well      Relevant Medications   sertraline (ZOLOFT) 50 MG tablet   Morbid obesity ([place])    Pt has still not been able to get semaglutide (wegovy) due to low supply at pharmacies Interested in zepbound as another option  Inst to check with pharmacy , see if avail and check ins coverage   She still eats sub optimally due to picky eating/intol of healthier foods   Enc starting exercise for this and also anxiety

## 2022-07-08 NOTE — Assessment & Plan Note (Signed)
Pt has still not been able to get semaglutide (wegovy) due to low supply at pharmacies Interested in zepbound as another option  Inst to check with pharmacy , see if avail and check ins coverage   She still eats sub optimally due to picky eating/intol of healthier foods   Enc starting exercise for this and also anxiety

## 2022-07-17 ENCOUNTER — Ambulatory Visit (INDEPENDENT_AMBULATORY_CARE_PROVIDER_SITE_OTHER): Payer: 59 | Admitting: Behavioral Health

## 2022-07-17 DIAGNOSIS — F419 Anxiety disorder, unspecified: Secondary | ICD-10-CM | POA: Diagnosis not present

## 2022-07-17 DIAGNOSIS — F32A Depression, unspecified: Secondary | ICD-10-CM | POA: Diagnosis not present

## 2022-07-17 NOTE — Progress Notes (Signed)
[facility] Counselor Initial Adult Exam  Name: Karla Grant Date: 07/17/2022 MRN: [id number] DOB: 1990-09-30 PCP: Abner Greenspan, MD  Time spent: 60 min In Person @ [facility] [place]:  Self    Paperwork requested: No   Reason for Visit /Presenting Problem: Pt realizes she needs psychotherapy to help her change thought patterns & to manage her anx/dep better. Pt is not naive to Th process & will provide feedback & input.  Mental Status Exam: Appearance:   Casual     Behavior:  Appropriate and Sharing  Motor:  Normal  Speech/Language:   Clear and Coherent and fast-paced  Affect:  Appropriate  Mood:  normal  Thought process:  normal  Thought content:    WNL  Sensory/Perceptual disturbances:    WNL  Orientation:  oriented to person, place, and time/date  Attention:  Good  Concentration:  Good  Memory:  WNL  Fund of knowledge:   Good  Insight:    Good  Judgment:   Good  Impulse Control:  Good    Risk Assessment: Danger to Self:  No Self-injurious Behavior: No Danger to Others: No Duty to Warn:no Physical Aggression / Violence:No  Access to Firearms a concern: No  Gang Involvement:No  Patient / guardian was educated about steps to take if suicide or homicide risk level increases between visits: yes; appropriate resources provided While future psychiatric events cannot be accurately predicted, the patient does not currently require acute inpatient psychiatric care and does not currently meet [facility] involuntary commitment criteria.  Substance Abuse History: Current substance abuse: No     Past Psychiatric History:   Previous psychological history is significant for anxiety Outpatient Providers: Loura Pardon, MD History of Psych Hospitalization: No  Psychological Testing:  NA    Abuse History:  Victim of: No.,  NA    Report needed: No. Victim of Neglect:No. Perpetrator of  NA   Witness / Exposure to Domestic Violence: No    Protective Services Involvement: No  Witness to [employer] Violence:  No   Family History:  Family History  Problem Relation Age of Onset   Liver cancer Father    Diabetes Mellitus II Father    Diabetes Maternal Grandmother    Heart disease Maternal Grandfather    Cancer Paternal Grandfather        cancer    Living situation: the patient lives alone since recently moving out of Parents' home  Sexual Orientation: Straight  Relationship Status: single  Name of spouse / other: NA If a parent, number of children / ages: NA  Support Systems: friends parents Sibling & Pt are not that close, but trying to connect more  Financial Stress:  No , but not a great deal of disposable income   Income/Employment/Disability: Employment @ [employer] [address] Service: No   Educational History: Education: high school diploma/GED  Religion/Sprituality/World View: Pt attends a Games developer in [place] goes [employer] or watches on TV  Any cultural differences that may affect / interfere with treatment:  None noted today  Recreation/Hobbies: reading; Pt attends [employer] twice a month, she likes to eat out w/friends, she enjoys her 2 dogs & spends time w/her Parents Lori & Elenore Rota  Stressors: Other: Mental Health challenges w/her anxiety & worry, habit of catastrophizing, & Pt hopes to, "break down my walls".    Strengths: Supportive Relationships, Family, Friends, Church, Hopefulness, Conservator, museum/gallery, Able to [employer],  and resiliency @ workplace  Barriers:  None noted today   Legal History: Pending legal issue / charges: The patient has no significant history of legal issues. History of legal issue / charges:  NA  Medical History/Surgical History: reviewed Past Medical History:  Diagnosis Date   Anxiety    Depression    Headache(784.0)    Hypoglycemia    Obesity     Past Surgical History:  Procedure Laterality Date    COLONOSCOPY WITH PROPOFOL N/A 02/18/2020   Procedure: COLONOSCOPY WITH PROPOFOL;  Surgeon: Carol Ada, MD;  Location: [facility];  Service: Endoscopy;  Laterality: N/A;   POLYPECTOMY  02/18/2020   Procedure: POLYPECTOMY;  Surgeon: Carol Ada, MD;  Location: [facility];  Service: Endoscopy;;    Medications: Current Outpatient Medications  Medication Sig Dispense Refill   cetirizine (ZYRTEC) 10 MG tablet Take 10 mg by mouth daily.     levonorgestrel (KYLEENA) 19.5 MG IUD 1 each by Intrauterine route once.      Semaglutide-Weight Management 0.25 MG/0.5ML SOAJ Inject 0.25 mg into the skin once a week. (Patient not taking: Reported on 07/08/2022) 3 mL 1   sertraline (ZOLOFT) 50 MG tablet Take 1.5 tablets (75 mg total) by mouth at bedtime. 135 tablet 1   No current facility-administered medications for this visit.    Allergies  Allergen Reactions   Eletriptan Hydrobromide     REACTION: chest pain   Topiramate     REACTION: no help    Diagnoses:  Anxiety and depression  Plan of Care: Pt will attend scheduled sessions every 2-3 wks.  Target Date: 08/17/2022  Progress: 3  Frequency: Twice monthly  Modality: Aloha Gell c/o need for more improvement w/her worry, catastrophizing, putting up walls, difficult sleep issues & talking excessively. She will use the suggestions provided to improve her sleep & her routine in her Apt. She will review the Sleep Hygiene Info pdf to assist her sleep in areas she has not already addressed.  Target Date: 08/17/2022  Progress: 3  Frequency: Twice monthly  Modality: [name], [name]

## 2022-07-17 NOTE — Progress Notes (Signed)
                Walida Cajas L Terasa Orsini, [name] 

## 2022-07-31 IMAGING — CT CT ABD-PELV W/ CM
2 of 4 series · 16 of 46 positions shown, 18 images · IV contrast (Omnipaque)
Comparison: None

CLINICAL DATA: Left lower quadrant abdominal pain, burning
sensation, bright red blood per rectum

EXAM:
CT ABDOMEN AND PELVIS WITH CONTRAST
TECHNIQUE: Multidetector CT imaging of the abdomen and pelvis was performed
using the standard protocol following bolus administration of
intravenous contrast.
CONTRAST:  100mL OMNIPAQUE IOHEXOL 300 MG/ML  SOLN

[Series 2: axial st · axial · 0.87mm/px · z∈[+743,+1183]mm · 13 of 98 slices shown, 15 images]
[im 5/98  soft-tissue]
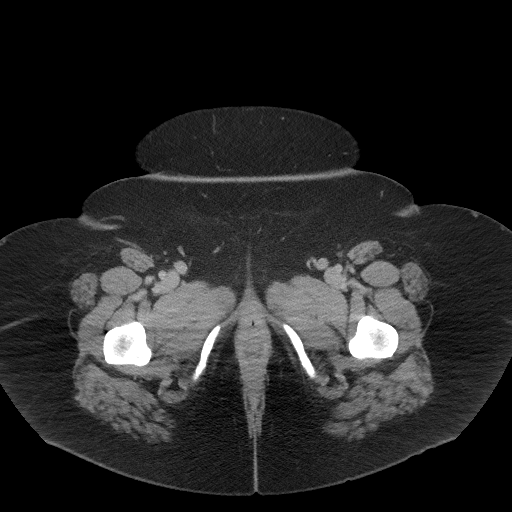
[im 5/98  bone]
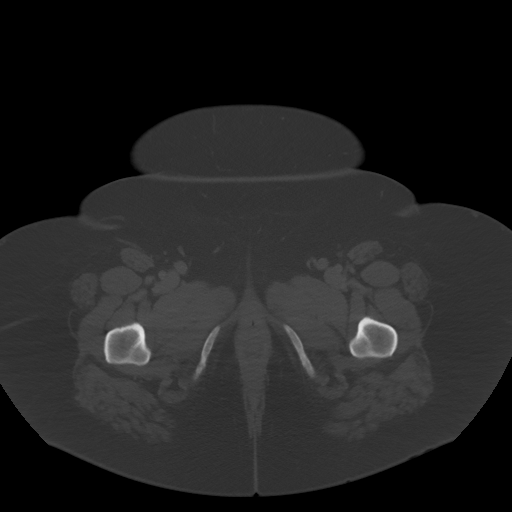
[im 13/98  soft-tissue]
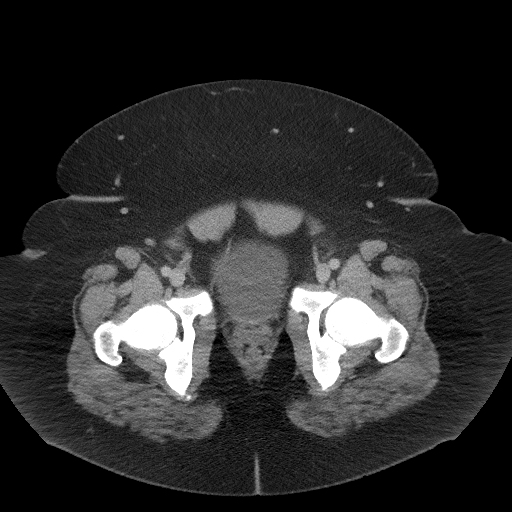
[im 21/98  soft-tissue]
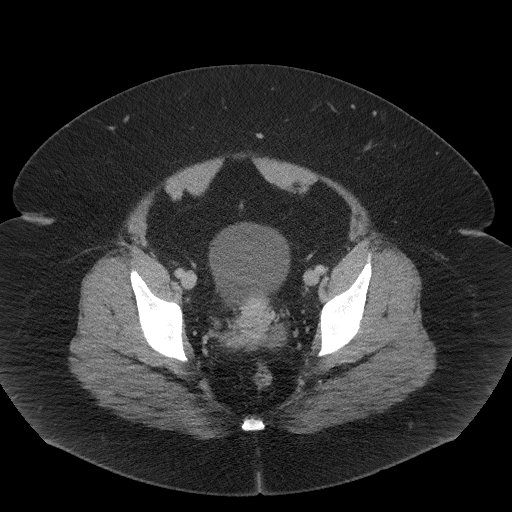
[im 29/98  soft-tissue]
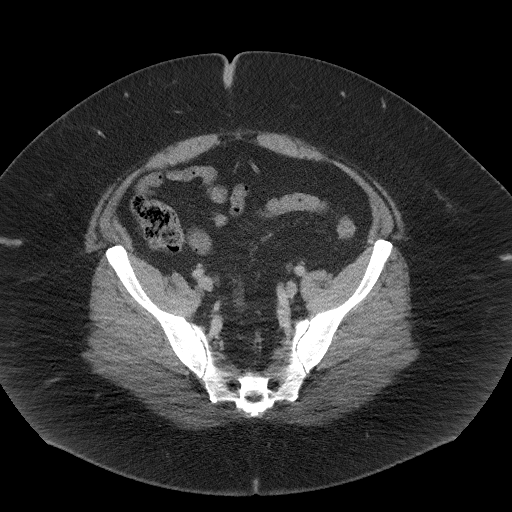
[im 33/98  soft-tissue]
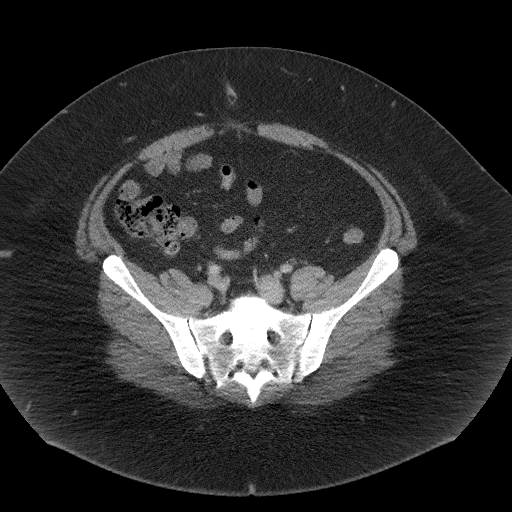
[im 41/98  soft-tissue]
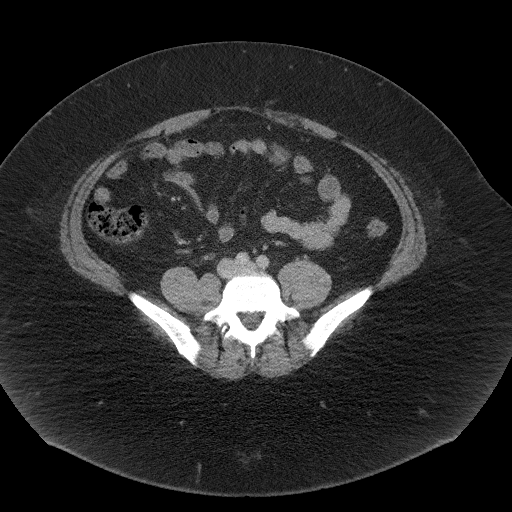
[im 49/98  soft-tissue]
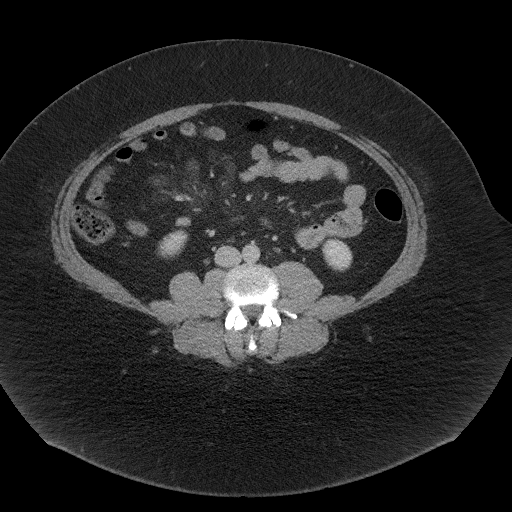
[im 57/98  soft-tissue]
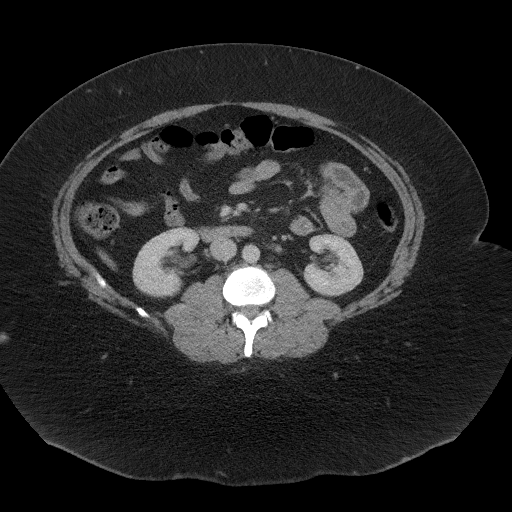
[im 65/98  soft-tissue]
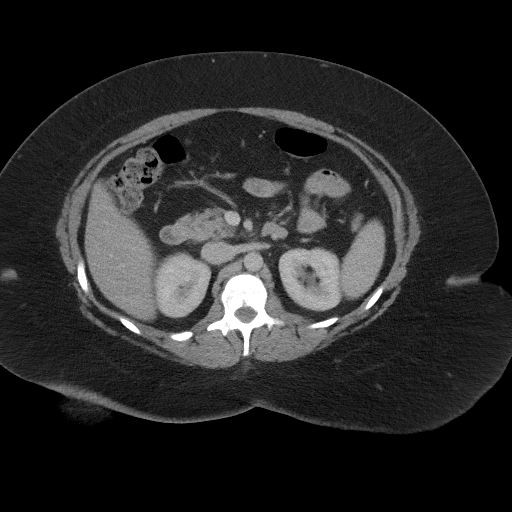
[im 65/98  bone]
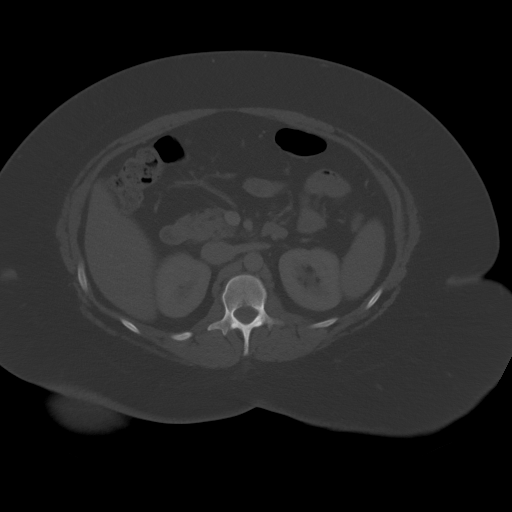
[im 69/98  soft-tissue]
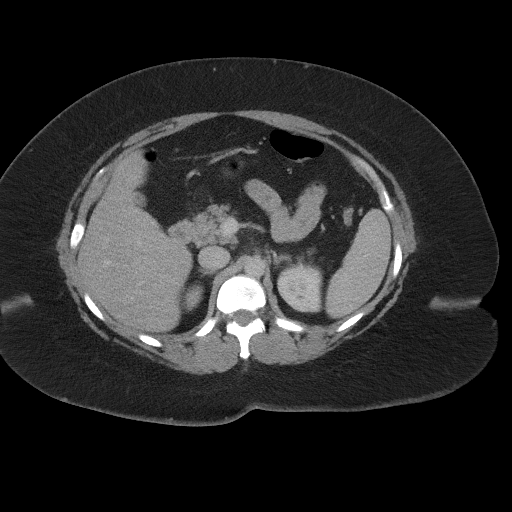
[im 77/98  soft-tissue]
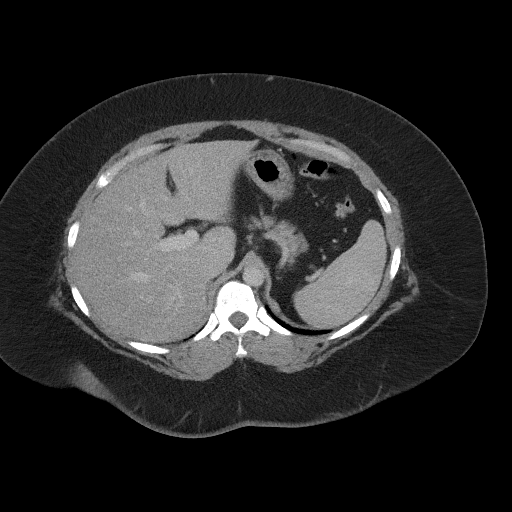
[im 85/98  soft-tissue]
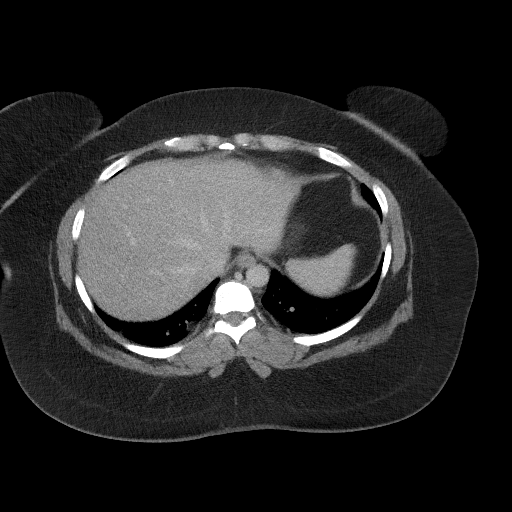
[im 93/98  soft-tissue]
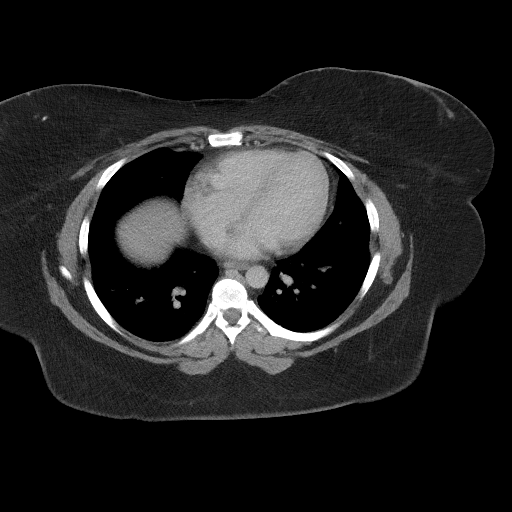

[Series 5: coronal st · coronal · 0.85mm/px · 3 of 104 slices shown]
[im 35/104  soft-tissue]
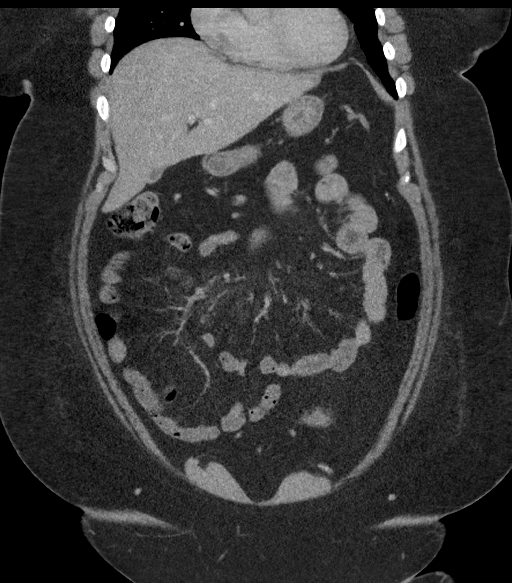
[im 46/104  soft-tissue]
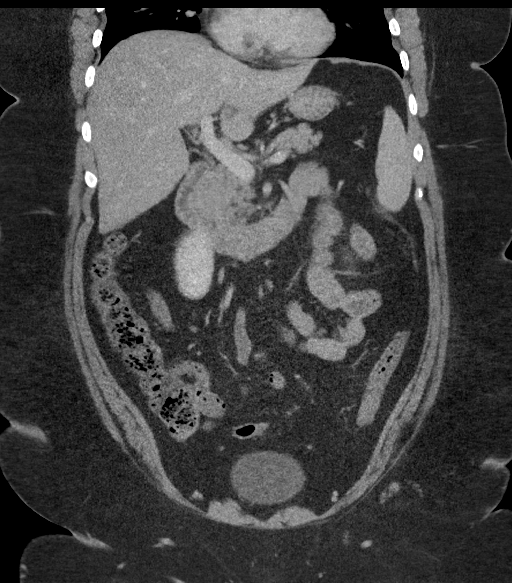
[im 58/104  soft-tissue]
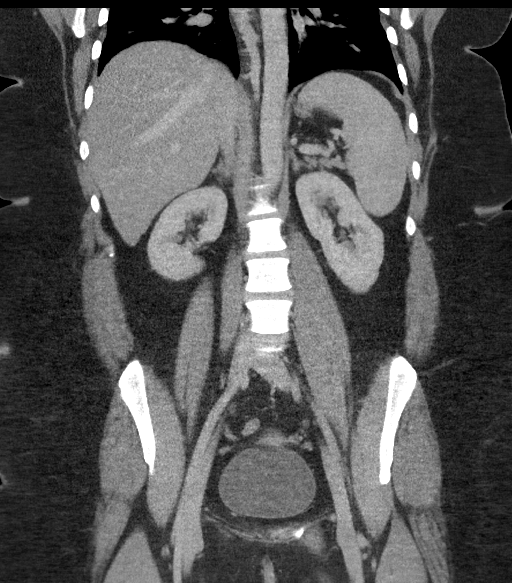

[16 of 46 positions shown; findings below may reference images not displayed]

FINDINGS: Lower chest: Lung bases are clear. Normal heart size. No pericardial
effusion.

Hepatobiliary: Hepatic attenuation and enhancement within expected
normals for phase of contrast. Smooth surface contour. No focal
concerning liver lesions. Gallbladder and biliary tree are
unremarkable without visible calcified gallstones.

Pancreas: Unremarkable. No pancreatic ductal dilatation or
surrounding inflammatory changes.

Spleen: Normal in size. No concerning splenic lesions.

Adrenals/Urinary Tract: Normal adrenal glands. Kidneys enhance
symmetrically and uniformly. No concerning renal mass, urolithiasis
or frank hydronephrosis. Urinary bladder is unremarkable.

Stomach/Bowel: Distal esophagus, stomach and duodenal sweep are
unremarkable. There is notable hazy appearance of the mesentery in
the right lower quadrant (a 'misty mesentery') but without
associated small bowel wall thickening or dilatation. A normal
appendix is visualized. No colonic dilatation or wall thickening.

Vascular/Lymphatic: Hazy mesenteric changes in right lower quadrant
with some reactive appearing adenopathy. No pathologically enlarged
nodes. No significant vascular findings.

Reproductive: Anteverted uterus with expected positioning of an IUD.
No concerning adnexal lesions.

Other: No abdominopelvic free fluid or free gas. No bowel containing
hernias.

Musculoskeletal: No acute osseous abnormality or suspicious osseous
lesion.
IMPRESSION: 1. Hazy appearance of the mesentery in the right lower quadrant (a
'misty mesentery') but without associated small bowel wall
thickening or dilatation. Findings may reflect a mesenteritis.
Enteritis less favored without associated bowel findings.
2. No other acute abnormality in the abdomen or pelvis.
3. Normally positioned IUD.

## 2022-08-05 ENCOUNTER — Ambulatory Visit: Payer: 59 | Admitting: Behavioral Health

## 2022-08-05 DIAGNOSIS — F32A Depression, unspecified: Secondary | ICD-10-CM | POA: Diagnosis not present

## 2022-08-05 DIAGNOSIS — F419 Anxiety disorder, unspecified: Secondary | ICD-10-CM

## 2022-08-05 NOTE — Progress Notes (Signed)
[facility] Counselor/Therapist Progress Note  Patient ID: MCKAILEY DEMAREST, MRN: [id number],    Date: 08/05/2022  Time Spent: 65 min In Person @ [facility] Office   Treatment Type: Individual Therapy  Reported Symptoms: Pt is seeking a companion she can spend time with, grow & get to know; someone w/a sense of humor, a car, job & a home. Pt has worked hard to live independently of her Parents & she values these objectives. Pt wants someone to share her [place] home & that is suitable in her Parent's mind for her. She is willing to be patient, realizing there is a greater plan than her plan.  Mental Status Exam: Appearance:  Casual     Behavior: Appropriate and Sharing  Motor: Normal  Speech/Language:  Clear and Coherent  Affect: Appropriate  Mood: normal  Thought process: normal  Thought content:   WNL  Sensory/Perceptual disturbances:   WNL  Orientation: oriented to person, place, and time/date  Attention: Good  Concentration: Good  Memory: WNL  Fund of knowledge:  Good  Insight:   Good  Judgment:  Good  Impulse Control: Good   Risk Assessment: Danger to Self:  No Self-injurious Behavior: No Danger to Others: No Duty to Warn:no Physical Aggression / Violence:No  Access to Firearms a concern: No  Gang Involvement:No   Subjective: Pt is trying to use the suggestions given last session to "retrain her brain". She is finding some success in re-directing her thoughts when she obsesses & catastrophizes. It is helping her be more positive.  Interventions: Interpersonal  Diagnosis:Anxiety and depression  Plan: Simren has inc'd her social time outside of her & her Parent's homes; she has attended a party invited by a Work Colleague & she has seen a man on a few occasions she finds interesting. She will cont to venture outside her home to be out in [place] her opportunities for social connection.   Target Date: 09/03/2022  Progress: 5  Frequency: Once every 3  wks  Modality: [name], [name]

## 2022-08-05 NOTE — Progress Notes (Signed)
                Yaniel Limbaugh L Jalan Fariss, [name] 

## 2022-08-26 ENCOUNTER — Ambulatory Visit: Payer: 59 | Admitting: Behavioral Health

## 2022-08-26 DIAGNOSIS — F32A Depression, unspecified: Secondary | ICD-10-CM | POA: Diagnosis not present

## 2022-08-26 DIAGNOSIS — F419 Anxiety disorder, unspecified: Secondary | ICD-10-CM | POA: Diagnosis not present

## 2022-08-26 NOTE — Progress Notes (Unsigned)
                Karla Grant L Keyion Knack, [name] 

## 2022-08-27 NOTE — Progress Notes (Signed)
[facility] Counselor/Therapist Progress Note  Patient ID: Karla Grant, MRN: [id number],    Date: 08/27/2022  Time Spent: 48 min In Person @ [facility]   Treatment Type: Individual Therapy  Reported Symptoms: Reduction in anx/dep & stress  Mental Status Exam: Appearance:  Casual     Behavior: Appropriate and Sharing  Motor: Normal  Speech/Language:  Clear and Coherent  Affect: Appropriate  Mood: anxious  Thought process: normal  Thought content:   WNL  Sensory/Perceptual disturbances:   WNL  Orientation: oriented to person, place, and time/date  Attention: Good  Concentration: Good  Memory: WNL  Fund of knowledge:  Good  Insight:   Good  Judgment:  Good  Impulse Control: Good   Risk Assessment: Danger to Self:  No Self-injurious Behavior: No Danger to Others: No Duty to Warn:no Physical Aggression / Violence:No  Access to Firearms a concern: No  Gang Involvement:No   Subjective: Pt is upbeat today. She has taken some time off from work for her Karla Grant & is having a relaxing week.   Interventions: Insight-Oriented  Diagnosis:Anxiety and depression  Plan: Karla Grant is doing well today & has been handling issues w/others better. She is getting out of her home more & doing inc'd things for self-care. She will cont to add the suggestions in session today & report back @ next visit.  Target Date: 09/26/2022  Progress: 6  Frequency: Twice monthly  Modality: [name], [name]

## 2022-09-11 ENCOUNTER — Other Ambulatory Visit: Payer: Self-pay | Admitting: Family Medicine

## 2022-09-11 MED ORDER — SEMAGLUTIDE-WEIGHT MANAGEMENT 0.25 MG/0.5ML ~~LOC~~ SOAJ
0.2500 mg | SUBCUTANEOUS | 0 refills | Status: DC
  Filled 2022-09-11 – 2022-10-07 (×3): qty 2, 28d supply, fill #0

## 2022-09-11 NOTE — Telephone Encounter (Signed)
Pt returned call would like a call back.  

## 2022-09-11 NOTE — Telephone Encounter (Signed)
Prescription Request  09/11/2022  LOV: 07/08/2022  What is the name of the medication or equipment? Semaglutide-Weight Management 0.25 MG/0.5ML SOAJ   Have you contacted your pharmacy to request a refill? No   Which pharmacy would you like this sent to?  [facility] [address] [place] [address] Phone: [phone] Fax: [phone]    Patient notified that their request is being sent to the clinical staff for review and that they should receive a response within 2 business days.   Please advise at Mobile [phone] (mobile)

## 2022-09-11 NOTE — Telephone Encounter (Signed)
LMTCB

## 2022-09-11 NOTE — Telephone Encounter (Signed)
Last filled on 04/24/22 #3 mL with 1 refill, CPE scheduled 10/07/22

## 2022-09-11 NOTE — Telephone Encounter (Signed)
Has she been able to get this?  If so how is she tolerating it so far?  If on for a month or more we can go up on the dose- just let me know (next dose is 0.5 mg weekly)

## 2022-09-11 NOTE — Telephone Encounter (Signed)
Pt hasn't been able to get med this will be a new stat. Pt called and [facility] verified that they did have it so she wants to start it now. Pt advised of PCP's comments that if tolerating it for a month or more we can increase dose so she will f/u with [place] once she starts in a month or so to let us know but for now she will just need the starter dose.

## 2022-09-12 ENCOUNTER — Other Ambulatory Visit (HOSPITAL_COMMUNITY): Payer: Self-pay

## 2022-09-16 ENCOUNTER — Other Ambulatory Visit (HOSPITAL_COMMUNITY): Payer: Self-pay

## 2022-09-17 ENCOUNTER — Other Ambulatory Visit (HOSPITAL_COMMUNITY): Payer: Self-pay

## 2022-09-19 ENCOUNTER — Other Ambulatory Visit (HOSPITAL_COMMUNITY): Payer: Self-pay

## 2022-09-26 ENCOUNTER — Other Ambulatory Visit (HOSPITAL_COMMUNITY): Payer: Self-pay

## 2022-09-29 ENCOUNTER — Telehealth: Payer: Self-pay | Admitting: Family Medicine

## 2022-09-29 DIAGNOSIS — R7309 Other abnormal glucose: Secondary | ICD-10-CM

## 2022-09-29 DIAGNOSIS — Z0000 Encounter for general adult medical examination without abnormal findings: Secondary | ICD-10-CM

## 2022-09-29 NOTE — Telephone Encounter (Signed)
-----   Message from Alvina Chou sent at 09/12/2022 10:53 AM EDT ----- Regarding: Lab orders for Monday, 4.8.24 Patient is scheduled for CPX labs, please order future labs, Thanks , Camelia Eng

## 2022-09-30 ENCOUNTER — Other Ambulatory Visit (INDEPENDENT_AMBULATORY_CARE_PROVIDER_SITE_OTHER): Payer: 59

## 2022-09-30 ENCOUNTER — Ambulatory Visit (INDEPENDENT_AMBULATORY_CARE_PROVIDER_SITE_OTHER): Payer: 59 | Admitting: Behavioral Health

## 2022-09-30 DIAGNOSIS — R7309 Other abnormal glucose: Secondary | ICD-10-CM | POA: Diagnosis not present

## 2022-09-30 DIAGNOSIS — Z0000 Encounter for general adult medical examination without abnormal findings: Secondary | ICD-10-CM

## 2022-09-30 DIAGNOSIS — F419 Anxiety disorder, unspecified: Secondary | ICD-10-CM

## 2022-09-30 DIAGNOSIS — F32A Depression, unspecified: Secondary | ICD-10-CM

## 2022-09-30 LAB — CBC WITH DIFFERENTIAL/PLATELET
Basophils Absolute: 0 10*3/uL (ref 0.0–0.1)
Basophils Relative: 0.3 % (ref 0.0–3.0)
Eosinophils Absolute: 0.3 10*3/uL (ref 0.0–0.7)
Eosinophils Relative: 4 % (ref 0.0–5.0)
HCT: 42.4 % (ref 36.0–46.0)
Hemoglobin: 13.9 g/dL (ref 12.0–15.0)
Lymphocytes Relative: 30.6 % (ref 12.0–46.0)
Lymphs Abs: 2.2 10*3/uL (ref 0.7–4.0)
MCHC: 32.9 g/dL (ref 30.0–36.0)
MCV: 89.6 fl (ref 78.0–100.0)
Monocytes Absolute: 0.6 10*3/uL (ref 0.1–1.0)
Monocytes Relative: 7.5 % (ref 3.0–12.0)
Neutro Abs: 4.2 10*3/uL (ref 1.4–7.7)
Neutrophils Relative %: 57.6 % (ref 43.0–77.0)
Platelets: 295 10*3/uL (ref 150.0–400.0)
RBC: 4.73 Mil/uL (ref 3.87–5.11)
RDW: 13.3 % (ref 11.5–15.5)
WBC: 7.3 10*3/uL (ref 4.0–10.5)

## 2022-09-30 LAB — COMPREHENSIVE METABOLIC PANEL
ALT: 18 U/L (ref 0–35)
AST: 17 U/L (ref 0–37)
Albumin: 3.6 g/dL (ref 3.5–5.2)
Alkaline Phosphatase: 61 U/L (ref 39–117)
BUN: 10 mg/dL (ref 6–23)
CO2: 27 mEq/L (ref 19–32)
Calcium: 8.8 mg/dL (ref 8.4–10.5)
Chloride: 105 mEq/L (ref 96–112)
Creatinine, Ser: 0.71 mg/dL (ref 0.40–1.20)
GFR: 111.19 mL/min (ref >60.00)
Glucose, Bld: 99 mg/dL (ref 70–99)
Potassium: 4 mEq/L (ref 3.5–5.1)
Sodium: 139 mEq/L (ref 135–145)
Total Bilirubin: 0.6 mg/dL (ref 0.2–1.2)
Total Protein: 6.6 g/dL (ref 6.0–8.3)

## 2022-09-30 LAB — LIPID PANEL
Cholesterol: 153 mg/dL (ref 0–200)
HDL: 44.5 mg/dL (ref >39.00)
LDL Cholesterol: 96 mg/dL (ref 0–99)
NonHDL: 108.75
Total CHOL/HDL Ratio: 3
Triglycerides: 64 mg/dL (ref 0.0–149.0)
VLDL: 12.8 mg/dL (ref 0.0–40.0)

## 2022-09-30 LAB — TSH: TSH: 1.89 u[IU]/mL (ref 0.35–5.50)

## 2022-09-30 LAB — HEMOGLOBIN A1C: Hgb A1c MFr Bld: 5.2 % (ref 4.6–6.5)

## 2022-09-30 NOTE — Progress Notes (Unsigned)
                Karla Grant Karla Areliz Rothman, [name] 

## 2022-10-01 ENCOUNTER — Other Ambulatory Visit (HOSPITAL_COMMUNITY): Payer: Self-pay

## 2022-10-01 NOTE — Progress Notes (Signed)
[facility] Counselor/Therapist Progress Note  Patient ID: Karla Grant, MRN: [id number],    Date: 10/01/2022  Time Spent: 55 min In Person @ [facility] Office   Treatment Type: Individual Therapy  Reported Symptoms: Reduction in anx/dep & stress  Mental Status Exam: Appearance:  Casual     Behavior: Appropriate and Sharing  Motor: Normal  Speech/Language:  Clear and Coherent  Affect: Appropriate  Mood: normal  Thought process: normal  Thought content:   WNL  Sensory/Perceptual disturbances:   WNL  Orientation: oriented to person, place, and time/date  Attention: Good  Concentration: Good  Memory: WNL  Fund of knowledge:  Good  Insight:   Good  Judgment:  Good  Impulse Control: Good   Risk Assessment: Danger to Self:  No Self-injurious Behavior: No Danger to Others: No Duty to Warn:no Physical Aggression / Violence:No  Access to Firearms a concern: No  Gang Involvement:No   Subjective: Pt expresses a fairly good cpl of wks & feels things are going well. She is using the skills & coping techniques from psychotherapy to assist her to navigate her world more easily. Work is going well & Family life is on a positive upturn. Pt is upbeat today.   Interventions: Solution-Oriented/Positive Psychology  Diagnosis:Anxiety and depression  Plan: Pt agrees to move therapy sessions out to once monthly.  Target Date: 10/22/2022  Progress: 8  Frequency: Once monthly  Modality: Claretta Fraise, [name]

## 2022-10-03 ENCOUNTER — Other Ambulatory Visit (HOSPITAL_COMMUNITY): Payer: Self-pay

## 2022-10-07 ENCOUNTER — Ambulatory Visit (INDEPENDENT_AMBULATORY_CARE_PROVIDER_SITE_OTHER): Payer: 59 | Admitting: Family Medicine

## 2022-10-07 ENCOUNTER — Other Ambulatory Visit (HOSPITAL_COMMUNITY): Payer: Self-pay

## 2022-10-07 ENCOUNTER — Encounter: Payer: Self-pay | Admitting: Family Medicine

## 2022-10-07 VITALS — BP 128/80 | HR 72 | Temp 98.0°F | Ht 61.25 in | Wt 277.4 lb

## 2022-10-07 DIAGNOSIS — Z0000 Encounter for general adult medical examination without abnormal findings: Secondary | ICD-10-CM | POA: Diagnosis not present

## 2022-10-07 DIAGNOSIS — G4733 Obstructive sleep apnea (adult) (pediatric): Secondary | ICD-10-CM

## 2022-10-07 DIAGNOSIS — R7309 Other abnormal glucose: Secondary | ICD-10-CM | POA: Diagnosis not present

## 2022-10-07 DIAGNOSIS — G4730 Sleep apnea, unspecified: Secondary | ICD-10-CM | POA: Insufficient documentation

## 2022-10-07 DIAGNOSIS — Z23 Encounter for immunization: Secondary | ICD-10-CM

## 2022-10-07 DIAGNOSIS — F341 Dysthymic disorder: Secondary | ICD-10-CM | POA: Diagnosis not present

## 2022-10-07 MED ORDER — SERTRALINE HCL 50 MG PO TABS: 75.0000 mg | ORAL_TABLET | Freq: Every day | ORAL | 3 refills | Status: DC

## 2022-10-07 NOTE — Assessment & Plan Note (Signed)
Discussed how this problem influences overall health and the risks it imposes  Reviewed plan for weight loss with lower calorie diet (via better food choices and also portion control or program like weight watchers) and exercise building up to or more than 30 minutes 5 days per week including some aerobic activity   Ins will not pay for GLP drug so far  Enc to add strength training Doing better with picky eating now - more salads/veggies

## 2022-10-07 NOTE — Assessment & Plan Note (Signed)
Pt struggles with this  Had home study and tried cpap briefly (in [place]) and could not get it to fit  More tired now Struggles to try and loose weight  Would like to discuss re testing and tx options Ref to pulm done

## 2022-10-07 NOTE — Assessment & Plan Note (Addendum)
Reviewed health habits including diet and exercise and skin cancer prevention Reviewed appropriate screening tests for age  Also reviewed health mt list, fam hx and immunization status , as well as social and family history   See HPI Labs reviewed and ordered  Tdap updated (there will be a new baby in family) Utd gyn care and has iud  Enc self breast exams Colonoscopy 2021 with 7 y recall  Stable mood   OGQ if 9-last time  on tx with counseling and needs OSA tx

## 2022-10-07 NOTE — Assessment & Plan Note (Signed)
Per pt stable Continues sertraline 75 mg daily  Reviewed stressors/ coping techniques/symptoms/ support sources/ tx options and side effects in detail today Stays tired Osa may play a role Continues counseling  Enc more exercise

## 2022-10-07 NOTE — Assessment & Plan Note (Signed)
Lab Results  Component Value Date   HGBA1C 5.2 09/30/2022   disc imp of low glycemic diet and wt loss to prevent DM2

## 2022-10-07 NOTE — Progress Notes (Signed)
Subjective:    Patient ID: Karla Grant, female    DOB: Nov 18, 1990, 31 y.o.   MRN: [id number]  HPI Here for health maintenance exam and to review chronic medical problems    Wt Readings from Last 3 Encounters:  10/07/22 277 lb 6 oz (125.8 kg)  07/08/22 279 lb 6 oz (126.7 kg)  06/03/22 278 lb (126.1 kg)   51.98 kg/m  Vitals:   10/07/22 1053  BP: 128/80  Pulse: 72  Temp: 98 F (36.7 C)  SpO2: 97%   Has been ok  Insurance will not cover GLP drug Never got to try it  Even with coupon cannot afford   Had a stomach bug last week   Enjoying living by herself now  Has her dogs  Had baby shower for her sister - a lot of work but fun   More walking around the house for exercise Doing more in general  Has a gym in her complex   Has mild sleep apnea  Could not get her machine to fit well    Immunization History  Administered Date(s) Administered   Influenza Split 06/26/2012   Influenza,inj,Quad PF,6+ Mos 03/15/2016, 08/15/2017, 03/09/2018, 04/24/2022   Influenza-Unspecified 04/25/2014, 03/09/2015, 02/22/2021   Td 04/25/2003   Tdap 12/28/2012    Health Maintenance Due  Topic Date Due   COVID-19 Vaccine (1) Never done   Hepatitis C Screening  Never done   Tdap was 12/2012-wants to update   Pap 07/2021  Sees gyn Dr Henderson Cloud  Had LEEP in past  IUD kyleena for contraception -this fall   Had std screening 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 With some chicken   Self breast exam- no lumps  Had a bug bite with a bruise on L breast-is gradually fading    Had a colonoscopy in 2021 -had 2 small polyps (7 year recall)  Will need next one at age 40 since her father had polyps  Mood : overall pretty good , is tried from lack of sleep on and off  H/o depression/ anxiety  Sertraline 75 mg daily -is helping  Less anxous   Sees a counselor [place] Winstead- that is going       07/08/2022   11:39 AM 04/24/2022     3:16 PM 10/29/2021    2:33 PM 09/17/2021    9:28 AM 09/17/2021    9:02 AM  Depression screen PHQ 2/9  Decreased Interest 1 0 0 1 1  Down, Depressed, Hopeless 1 1 0 0 0  PHQ - 2 Score 2 1 0 1 1  Altered sleeping 3 2     Tired, decreased energy 2 1     Change in appetite 0 0     Feeling bad or failure about yourself  1 1     Trouble concentrating 1 1     Moving slowly or fidgety/restless 0 0     Suicidal thoughts 0 0     PHQ-9 Score 9 6     Difficult doing work/chores Somewhat difficult Not difficult at all      Working on self care More word search puzzles instead of screen  Bought a coloring book    Blood glucose Lab Results  Component Value Date   HGBA1C 5.2 09/30/2022   Prev 4.9    Labs  Cholesterol Lab Results  Component Value Date   CHOL 153 09/30/2022   CHOL 176 09/17/2021  CHOL 161 08/18/2018   Lab Results  Component Value Date   HDL 44.50 09/30/2022   HDL 49.50 09/17/2021   HDL 39 (L) 08/18/2018   Lab Results  Component Value Date   LDLCALC 96 09/30/2022   LDLCALC 108 (H) 09/17/2021   LDLCALC 95 08/18/2018   Lab Results  Component Value Date   TRIG 64.0 09/30/2022   TRIG 94.0 09/17/2021   TRIG 136 08/18/2018   Lab Results  Component Value Date   CHOLHDL 3 09/30/2022   CHOLHDL 4 09/17/2021   CHOLHDL 4.1 08/18/2018   No results found for: "LDLDIRECT"   Last metabolic panel Lab Results  Component Value Date   GLUCOSE 99 09/30/2022   NA 139 09/30/2022   K 4.0 09/30/2022   CL 105 09/30/2022   CO2 27 09/30/2022   BUN 10 09/30/2022   CREATININE 0.71 09/30/2022   GFRNONAA >60 01/27/2020   CALCIUM 8.8 09/30/2022   PROT 6.6 09/30/2022   ALBUMIN 3.6 09/30/2022   LABGLOB 2.2 08/18/2018   AGRATIO 1.3 08/18/2018   BILITOT 0.6 09/30/2022   ALKPHOS 61 09/30/2022   AST 17 09/30/2022   ALT 18 09/30/2022   ANIONGAP 10 01/27/2020   GFR 111  Lab Results  Component Value Date   TSH 1.89 09/30/2022   Lab Results  Component Value Date    WBC 7.3 09/30/2022   HGB 13.9 09/30/2022   HCT 42.4 09/30/2022   MCV 89.6 09/30/2022   PLT 295.0 09/30/2022     Patient Active Problem List   Diagnosis Date Noted   Sleep apnea 10/07/2022   Lactose intolerance 03/11/2017   Insomnia 12/28/2012   Migraine 11/08/2011   Routine general medical examination at a health care facility 07/21/2011   Morbid obesity 03/09/2010   DEPRESSION/ANXIETY 08/16/2009   Elevated random blood glucose level 02/10/2009   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   Past Medical History:  Diagnosis Date   Anxiety    Depression    Headache(784.0)    Hypoglycemia    Obesity    Past Surgical History:  Procedure Laterality Date   COLONOSCOPY WITH PROPOFOL N/A 02/18/2020   Procedure: COLONOSCOPY WITH PROPOFOL;  Surgeon: Jeani Hawking, MD;  Location: [facility];  Service: Endoscopy;  Laterality: N/A;   POLYPECTOMY  02/18/2020   Procedure: POLYPECTOMY;  Surgeon: Jeani Hawking, MD;  Location: [facility];  Service: Endoscopy;;   Social History   Tobacco Use   Smoking status: Never    Passive exposure: Past   Smokeless tobacco: Never  Vaping Use   Vaping Use: Never used  Substance Use Topics   Alcohol use: Yes    Alcohol/week: 0.0 standard drinks of alcohol    Comment: Occasional   Drug use: No   Family History  Problem Relation Age of Onset   Liver cancer Father    Diabetes Mellitus II Father    Diabetes Maternal Grandmother    Heart disease Maternal Grandfather    Cancer Paternal Grandfather        cancer   Allergies  Allergen Reactions   Eletriptan Hydrobromide     REACTION: chest pain   Topiramate     REACTION: no help   Current Outpatient Medications on File Prior to Visit  Medication Sig Dispense Refill   cetirizine (ZYRTEC) 10 MG tablet Take 10 mg by mouth daily.     levonorgestrel (KYLEENA) 19.5 MG IUD 1 each by Intrauterine route once.      No current facility-administered medications on file prior to visit.  Review of Systems   Constitutional:  Negative for activity change, appetite change, fatigue, fever and unexpected weight change.  HENT:  Negative for congestion, ear pain, rhinorrhea, sinus pressure and sore throat.   Eyes:  Negative for pain, redness and visual disturbance.  Respiratory:  Negative for cough, shortness of breath and wheezing.   Cardiovascular:  Negative for chest pain and palpitations.  Gastrointestinal:  Negative for abdominal pain, blood in stool, constipation and diarrhea.  Endocrine: Negative for polydipsia and polyuria.  Genitourinary:  Negative for dysuria, frequency and urgency.  Musculoskeletal:  Negative for arthralgias, back pain and myalgias.  Skin:  Negative for pallor and rash.  Allergic/Immunologic: Negative for environmental allergies.  Neurological:  Negative for dizziness, syncope and headaches.  Hematological:  Negative for adenopathy. Does not bruise/bleed easily.  Psychiatric/Behavioral:  Negative for decreased concentration and dysphoric mood. The patient is not nervous/anxious.        Objective:   Physical Exam Constitutional:      General: She is not in acute distress.    Appearance: Normal appearance. She is well-developed. She is obese. She is not ill-appearing or diaphoretic.  HENT:     Head: Normocephalic and atraumatic.     Right Ear: Tympanic membrane, ear canal and external ear normal.     Left Ear: Tympanic membrane, ear canal and external ear normal.     Nose: Nose normal. No congestion.     Mouth/Throat:     Mouth: Mucous membranes are moist.     Pharynx: Oropharynx is clear. No posterior oropharyngeal erythema.  Eyes:     General: No scleral icterus.    Extraocular Movements: Extraocular movements intact.     Conjunctiva/sclera: Conjunctivae normal.     Pupils: Pupils are equal, round, and reactive to light.  Neck:     Thyroid: No thyromegaly.     Vascular: No carotid bruit or JVD.  Cardiovascular:     Rate and Rhythm: Normal rate and regular  rhythm.     Pulses: Normal pulses.     Heart sounds: Normal heart sounds.     No gallop.  Pulmonary:     Effort: Pulmonary effort is normal. No respiratory distress.     Breath sounds: Normal breath sounds. No wheezing.     Comments: Good air exch Chest:     Chest wall: No tenderness.  Abdominal:     General: Bowel sounds are normal. There is no distension or abdominal bruit.     Palpations: Abdomen is soft. There is no mass.     Tenderness: There is no abdominal tenderness.     Hernia: No hernia is present.  Genitourinary:    Comments: Breast and pelvic exam are done by gyn Musculoskeletal:        General: No tenderness. Normal range of motion.     Cervical back: Normal range of motion and neck supple. No rigidity. No muscular tenderness.     Right lower leg: No edema.     Left lower leg: No edema.     Comments: No kyphosis   Lymphadenopathy:     Cervical: No cervical adenopathy.  Skin:    General: Skin is warm and dry.     Coloration: Skin is not pale.     Findings: No erythema or rash.  Neurological:     Mental Status: She is alert. Mental status is at baseline.     Cranial Nerves: No cranial nerve deficit.     Motor: No abnormal muscle tone.  Coordination: Coordination normal.     Gait: Gait normal.     Deep Tendon Reflexes: Reflexes are normal and symmetric. Reflexes normal.  Psychiatric:        Mood and Affect: Mood normal.        Cognition and Memory: Cognition and memory normal.           Assessment & Plan:   Problem List Items Addressed This Visit       Respiratory   Sleep apnea    Pt struggles with this  Had home study and tried cpap briefly (in [place]) and could not get it to fit  More tired now Struggles to try and loose weight  Would like to discuss re testing and tx options Ref to pulm done       Relevant Orders   Ambulatory referral to Pulmonology     Other   DEPRESSION/ANXIETY    Per pt stable Continues sertraline 75 mg daily   Reviewed stressors/ coping techniques/symptoms/ support sources/ tx options and side effects in detail today Stays tired Osa may play a role Continues counseling  Enc more exercise       Relevant Medications   sertraline (ZOLOFT) 50 MG tablet   Elevated random blood glucose level    Lab Results  Component Value Date   HGBA1C 5.2 09/30/2022  disc imp of low glycemic diet and wt loss to prevent DM2       Morbid obesity    Discussed how this problem influences overall health and the risks it imposes  Reviewed plan for weight loss with lower calorie diet (via better food choices and also portion control or program like weight watchers) and exercise building up to or more than 30 minutes 5 days per week including some aerobic activity   Ins will not pay for GLP drug so far  Enc to add strength training Doing better with picky eating now - more salads/veggies       Routine general medical examination at a health care facility - Primary    Reviewed health habits including diet and exercise and skin cancer prevention Reviewed appropriate screening tests for age  Also reviewed health mt list, fam hx and immunization status , as well as social and family history   See HPI Labs reviewed and ordered  Tdap updated (there will be a new baby in family) Utd gyn care and has iud  Enc self breast exams Colonoscopy 2021 with 7 y recall  Stable mood   OGQ if 7-last time  on tx with counseling and needs OSA tx        Other Visit Diagnoses     Need for Tdap vaccination       Relevant Orders   Tdap vaccine greater than or equal to 7yo IM (Completed)

## 2022-10-07 NOTE — Patient Instructions (Addendum)
Keep walking  Add some strength training to your routine, this is important for bone and brain health and can reduce your risk of falls and help your body use insulin properly and regulate weight  Light weights, exercise bands , and internet videos are a good way to start  Yoga (chair or regular), machines , floor exercises or a gym with machines are also good options    So glad you are trying new foods Try to get most of your carbohydrates from produce (with the exception of white potatoes)  Eat less bread/pasta/rice/snack foods/cereals/sweets and other items from the middle of the grocery store (processed carbs)  Take care of yourself   Tdap vaccine today   Labs are stable   For sleep apnea I put the referral in for pulmonary for consult  Please let us know if you don't hear in 1-2 weeks

## 2022-10-21 NOTE — Progress Notes (Unsigned)
@Patient  ID: Karla Grant, female    DOB: 1991/04/01, 32 y.o.   MRN: [id number]  No chief complaint on file.   Referring provider: Tower, Audrie Gallus, MD  HPI: 32 year old female, never smoked. PMH significant for OSA.   10/23/2022 Patient presents today for sleep consult. She has history of mild sleep apnea (AHI 6/hour), previous on auto CPAP 5-15cm h20 but had issues with compliance due to CPAP tubing and nasal dryness. OSA was managed by [facility] health, she was last seen by them in April 2019.      Sleep questionnaire Symptoms-    Prior sleep study-  Bedtime- Time to fall asleep-  Nocturnal awakenings-  Out of bed/start of day-  Weight changes-  Do you operate heavy machinery-  Do you currently wear CPAP-  Do you current wear oxygen-  Epworth-     Allergies  Allergen Reactions   Eletriptan Hydrobromide     REACTION: chest pain   Topiramate     REACTION: no help    Immunization History  Administered Date(s) Administered   Influenza Split 06/26/2012   Influenza,inj,Quad PF,6+ Mos 03/15/2016, 08/15/2017, 03/09/2018, 04/24/2022   Influenza-Unspecified 04/25/2014, 03/09/2015, 02/22/2021   Td 04/25/2003   Tdap 12/28/2012, 10/07/2022    Past Medical History:  Diagnosis Date   Anxiety    Depression    Headache(784.0)    Hypoglycemia    Obesity     Tobacco History: Social History   Tobacco Use  Smoking Status Never   Passive exposure: Past  Smokeless Tobacco Never   Counseling given: Not Answered   Outpatient Medications Prior to Visit  Medication Sig Dispense Refill   cetirizine (ZYRTEC) 10 MG tablet Take 10 mg by mouth daily.     levonorgestrel (KYLEENA) 19.5 MG IUD 1 each by Intrauterine route once.      sertraline (ZOLOFT) 50 MG tablet Take 1.5 tablets (75 mg total) by mouth at bedtime. 135 tablet 3   No facility-administered medications prior to visit.      Review of Systems  Review of Systems   Physical Exam  There were no vitals taken  for this visit. Physical Exam   Lab Results:  CBC    Component Value Date/Time   WBC 7.3 09/30/2022 0819   RBC 4.73 09/30/2022 0819   HGB 13.9 09/30/2022 0819   HGB 14.2 08/18/2018 0801   HCT 42.4 09/30/2022 0819   HCT 43.4 08/18/2018 0801   PLT 295.0 09/30/2022 0819   PLT 314 08/18/2018 0801   MCV 89.6 09/30/2022 0819   MCV 91 08/18/2018 0801   MCH 29.0 01/27/2020 1243   MCHC 32.9 09/30/2022 0819   RDW 13.3 09/30/2022 0819   RDW 12.4 08/18/2018 0801   LYMPHSABS 2.2 09/30/2022 0819   LYMPHSABS 1.9 08/18/2018 0801   MONOABS 0.6 09/30/2022 0819   EOSABS 0.3 09/30/2022 0819   EOSABS 0.2 08/18/2018 0801   BASOSABS 0.0 09/30/2022 0819   BASOSABS 0.1 08/18/2018 0801    BMET    Component Value Date/Time   NA 139 09/30/2022 0819   NA 141 08/18/2018 0801   K 4.0 09/30/2022 0819   CL 105 09/30/2022 0819   CO2 27 09/30/2022 0819   GLUCOSE 99 09/30/2022 0819   BUN 10 09/30/2022 0819   BUN 11 08/18/2018 0801   CREATININE 0.71 09/30/2022 0819   CALCIUM 8.8 09/30/2022 0819   GFRNONAA >60 01/27/2020 1243   GFRAA >60 01/27/2020 1243    BNP No results found for: "  BNP"  ProBNP No results found for: "PROBNP"  Imaging: No results found.   Assessment & Plan:   No problem-specific Assessment & Plan notes found for this encounter.     Glenford Bayley, NP 10/21/2022

## 2022-10-23 ENCOUNTER — Encounter: Payer: Self-pay | Admitting: Primary Care

## 2022-10-23 ENCOUNTER — Ambulatory Visit: Payer: 59 | Admitting: Primary Care

## 2022-10-23 VITALS — BP 122/82 | HR 109 | Temp 98.5°F | Ht 61.0 in | Wt 283.0 lb

## 2022-10-23 DIAGNOSIS — G4733 Obstructive sleep apnea (adult) (pediatric): Secondary | ICD-10-CM

## 2022-10-23 DIAGNOSIS — R0683 Snoring: Secondary | ICD-10-CM

## 2022-10-23 DIAGNOSIS — Z8669 Personal history of other diseases of the nervous system and sense organs: Secondary | ICD-10-CM

## 2022-10-23 NOTE — Assessment & Plan Note (Addendum)
-   Hx mild sleep apnea, previously on CPAP but did not tolerate d/t mask issues. Continues to have snoring symptoms and restless sleep. Epworth score 9/24. Reviewed alternative treatment options for sleep apnea, she would be interested in being referred for oral appliance or re-trying CPAP with different mask. Needs repeat sleep study. Encourage weight loss efforts and side sleeping position. FU 1-2 weeks after sleep study to review treatment options if needed.

## 2022-10-23 NOTE — Patient Instructions (Addendum)
  Sleep apnea is defined as period of 10 seconds or longer when you stop breathing at night. This can happen multiple times a night. Dx sleep apnea is when this occurs more than 5 times an hour.    Mild OSA 5-15 apneic events an hour Moderate OSA 15-30 apneic events an hour Severe OSA > 30 apneic events an hour   Untreated sleep apnea puts you at higher risk for cardiac arrhythmias, pulmonary HTN, stroke and diabetes  Treatment options include weight loss, side sleeping position, oral appliance, CPAP therapy or referral to ENT for possible surgical options    Recommendations: Focus on side sleeping position or elevate head with wedge pillow 30 degrees Work on weight loss efforts as able  Do not drive if experiencing excessive daytime sleepiness of fatigue    Orders: Home sleep study re: loud snoring    Follow-up: Please call to schedule follow-up 2 weeks after completing home sleep study to review results and treatment if needed (can be virtual)

## 2022-10-24 NOTE — Progress Notes (Signed)
Reviewed and agree with assessment/plan.   Coralyn Helling, MD [facility] Pulmonary/Critical Care 10/24/2022, 8:36 AM Pager:  [phone]

## 2022-10-28 ENCOUNTER — Ambulatory Visit: Payer: 59 | Admitting: Behavioral Health

## 2022-10-28 DIAGNOSIS — F32A Depression, unspecified: Secondary | ICD-10-CM

## 2022-10-28 DIAGNOSIS — F419 Anxiety disorder, unspecified: Secondary | ICD-10-CM

## 2022-10-28 NOTE — Progress Notes (Signed)
[facility] Counselor/Therapist Progress Note  Patient ID: Karla Grant, MRN: [id number],    Date: 10/28/2022  Time Spent: 45 min In person @ [facility] Office  Treatment Type: Individual Therapy  Reported Symptoms: Pt is stable today & anticipating some upcoming financial issues, but planning for a PT job if her [employer] job will approve  Mental Status Exam: Appearance:  Casual and Neat     Behavior: Appropriate and Sharing  Motor: Normal  Speech/Language:  Clear and Coherent  Affect: Appropriate  Mood: normal  Thought process: normal  Thought content:   WNL  Sensory/Perceptual disturbances:   WNL  Orientation: oriented to person, place, and time/date  Attention: Good  Concentration: Good  Memory: WNL  Fund of knowledge:  Good  Insight:   Good  Judgment:  Good  Impulse Control: Good   Risk Assessment: Danger to Self:  No Self-injurious Behavior: No Danger to Others: No Duty to Warn:no Physical Aggression / Violence:No  Access to Firearms a concern: No  Gang Involvement:No   Subjective: Pt expresses upcoming PT job opp w/[employer]; she just needs her FT job's approval. She anticipates financial issues that need to be managed, so she is getting her PT job started. Family & home life is going well, along w/the health of her Parents & Str who is 8 mos preg & due in June.   Her medication is currently treating her Anx/Dep well (Lexapro 75mg  daily).  Interventions: Insight-Oriented and Family Systems  Diagnosis:Anxiety and depression  Plan: Karla Grant expresses some concern over her weight & possible Sleep Apnea. She has been tested via home setting once before & it was found to be borderline. She will be tested again. Clinician provided info to Pt for Wellness & Health Mgmt services through [facility] for her weight. She will contact her PCP about a Referral. We have agreed on monthly sessions.  Target Date: 12/07/2022  Progress: 7  Frequency: Once  every month  Modality: Claretta Fraise, [name]

## 2022-10-28 NOTE — Progress Notes (Signed)
                Perel Hauschild L Zackaria Burkey, [name] 

## 2022-11-25 ENCOUNTER — Ambulatory Visit: Payer: 59 | Admitting: Behavioral Health

## 2023-02-19 ENCOUNTER — Encounter: Payer: Self-pay | Admitting: Family Medicine

## 2023-02-20 NOTE — Telephone Encounter (Signed)
Needs visit for this to establish diagnosis , etc  Maybe there is something we can do to help her  Thanks

## 2023-02-26 ENCOUNTER — Ambulatory Visit (INDEPENDENT_AMBULATORY_CARE_PROVIDER_SITE_OTHER): Payer: 59 | Admitting: Family Medicine

## 2023-02-26 ENCOUNTER — Encounter: Payer: Self-pay | Admitting: Family Medicine

## 2023-02-26 VITALS — BP 116/70 | HR 66 | Temp 97.7°F | Ht 61.0 in | Wt 283.0 lb

## 2023-02-26 DIAGNOSIS — I872 Venous insufficiency (chronic) (peripheral): Secondary | ICD-10-CM | POA: Insufficient documentation

## 2023-02-26 NOTE — Progress Notes (Signed)
Subjective:    Patient ID: Karla Grant, female    DOB: [birth year], 32 y.o.   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 If swollen   Does have varicose veins   Wants to loose weight  GLP -1 not affordable    Does eat quick meals  Very picky eater  Not a lot of fruits and veggies Working 2 jobs- not a lot of time to shop and assemble food   Full time-[place] (up and down)  [employer] and weekends    Patient Active Problem List   Diagnosis Date Noted   Venous insufficiency 02/26/2023   Sleep apnea 10/07/2022   Lactose intolerance 03/11/2017   Insomnia 12/28/2012   Migraine 11/08/2011   Routine general medical examination at a health care facility 07/21/2011   Morbid obesity (HCC) 03/09/2010   DEPRESSION/ANXIETY 08/16/2009   Elevated random blood glucose level 02/10/2009   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   Past Medical History:  Diagnosis Date   Anxiety    Depression    Headache(784.0)    Hypoglycemia    Obesity    Past Surgical History:  Procedure Laterality Date   COLONOSCOPY WITH PROPOFOL N/A 02/18/2020   Procedure:  COLONOSCOPY WITH PROPOFOL;  Surgeon: Jeani Hawking, MD;  Location: [facility] ENDOSCOPY;  Service: Endoscopy;  Laterality: N/A;   POLYPECTOMY  02/18/2020   Procedure: POLYPECTOMY;  Surgeon: Jeani Hawking, MD;  Location: [facility] ENDOSCOPY;  Service: Endoscopy;;   Social History   Tobacco Use   Smoking status: Never    Passive exposure: Past   Smokeless tobacco: Never  Vaping Use   Vaping status: Never Used  Substance Use Topics   Alcohol use: Yes    Alcohol/week: 0.0 standard drinks of alcohol    Comment: Occasional   Drug use: No   Family History  Problem Relation Age of Onset   Liver cancer Father    Diabetes Mellitus II Father    Diabetes Maternal Grandmother    Heart disease Maternal Grandfather    Cancer Paternal Grandfather        cancer   Allergies  Allergen Reactions   Eletriptan Hydrobromide     REACTION: chest pain   Topiramate     REACTION: no help   Current Outpatient Medications on File Prior to Visit  Medication Sig Dispense Refill   cetirizine (ZYRTEC) 10 MG tablet Take 10 mg by mouth daily.  levonorgestrel (KYLEENA) 19.5 MG IUD 1 each by Intrauterine route once.      sertraline (ZOLOFT) 50 MG tablet Take 1.5 tablets (75 mg total) by mouth at bedtime. 135 tablet 3   No current facility-administered medications on file prior to visit.    Review of Systems  Constitutional:  Positive for fatigue. Negative for activity change, appetite change, fever and unexpected weight change.  HENT:  Negative for congestion, ear pain, rhinorrhea, sinus pressure and sore throat.   Eyes:  Negative for pain, redness and visual disturbance.  Respiratory:  Negative for cough, shortness of breath and wheezing.   Cardiovascular:  Negative for chest pain and palpitations.       Varicose veins in legs   Gastrointestinal:  Negative for abdominal pain, blood in stool, constipation and diarrhea.  Endocrine: Negative for polydipsia and polyuria.  Genitourinary:  Negative for dysuria,  frequency and urgency.  Musculoskeletal:  Negative for arthralgias, back pain and myalgias.       Leg and foot pain   Skin:  Negative for pallor and rash.  Allergic/Immunologic: Negative for environmental allergies.  Neurological:  Negative for dizziness, syncope and headaches.  Hematological:  Negative for adenopathy. Does not bruise/bleed easily.  Psychiatric/Behavioral:  Negative for decreased concentration and dysphoric mood. The patient is not nervous/anxious.        Objective:   Physical Exam Constitutional:      General: She is not in acute distress.    Appearance: Normal appearance. She is obese. She is not ill-appearing.  Eyes:     Conjunctiva/sclera: Conjunctivae normal.     Pupils: Pupils are equal, round, and reactive to light.  Cardiovascular:     Rate and Rhythm: Normal rate and regular rhythm.     Comments: Compresible small clusters of veins in legs  Primarily below knee  Mild tendenress   Pulmonary:     Effort: Pulmonary effort is normal. No respiratory distress.     Breath sounds: Normal breath sounds. No wheezing or rales.     Comments: No crackles  Musculoskeletal:     Cervical back: Normal range of motion and neck supple.     Right lower leg: No edema.     Left lower leg: No edema.  Lymphadenopathy:     Cervical: No cervical adenopathy.  Neurological:     Mental Status: She is alert.     Cranial Nerves: No cranial nerve deficit.     Sensory: No sensory deficit.     Motor: No weakness.     Coordination: Coordination normal.     Gait: Gait normal.     Deep Tendon Reflexes: Reflexes normal.  Psychiatric:        Mood and Affect: Mood normal.           Assessment & Plan:   Problem List Items Addressed This Visit       Cardiovascular and Mediastinum   Venous insufficiency - Primary    Worsening symtoms from varicosities in legs when stading 4 h for job Recommend supp socks/hose Recommend weight loss Elevate when sitting if possible  Work  note for accomodation to sit in between customers at [employer]         Other   Morbid obesity (HCC)    Issues standing long time-foot and leg pain  Walks when able Encouraged use of supp socks or hose  Continue supportive foot wear  Discussed how this problem influences overall health and the risks it imposes  Reviewed plan for  weight loss with lower calorie diet (via better food choices (lower glycemic and portion control) along with exercise building up to or more than 30 minutes 5 days per week including some aerobic activity and strength training   Note done for work to allow breaks to sit at counter

## 2023-02-26 NOTE — Assessment & Plan Note (Signed)
Worsening symtoms from varicosities in legs when stading 4 h for job Recommend supp socks/hose Recommend weight loss Elevate when sitting if possible  Work note for accomodation to sit in between customers at [employer]

## 2023-02-26 NOTE — Assessment & Plan Note (Signed)
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

## 2023-02-26 NOTE — Patient Instructions (Addendum)
Look for knee high socks with support (or hose)  They help a lot with leg pain and standing    (take them off for sleep)    Do the best you can with diet  Shop and prep food when you can  Keep trying new veggies and fruit   Weight loss will help feet and legs   Take care of yourself

## 2023-07-01 ENCOUNTER — Encounter: Payer: Self-pay | Admitting: Family Medicine

## 2023-08-10 LAB — HM HEPATITIS C SCREENING LAB: HM Hepatitis Screen: NEGATIVE

## 2023-09-29 ENCOUNTER — Telehealth: Payer: Self-pay | Admitting: Family Medicine

## 2023-09-29 DIAGNOSIS — Z0000 Encounter for general adult medical examination without abnormal findings: Secondary | ICD-10-CM

## 2023-09-29 DIAGNOSIS — R739 Hyperglycemia, unspecified: Secondary | ICD-10-CM

## 2023-09-29 NOTE — Telephone Encounter (Signed)
-----   Message from Alvina Chou sent at 09/12/2023 10:08 AM EDT ----- Regarding: Lab orders for Wed, 4.9.25 Patient is scheduled for CPX labs, please order future labs, Thanks , Camelia Eng

## 2023-10-01 ENCOUNTER — Other Ambulatory Visit (INDEPENDENT_AMBULATORY_CARE_PROVIDER_SITE_OTHER): Payer: 59

## 2023-10-01 DIAGNOSIS — R739 Hyperglycemia, unspecified: Secondary | ICD-10-CM

## 2023-10-01 DIAGNOSIS — Z0000 Encounter for general adult medical examination without abnormal findings: Secondary | ICD-10-CM | POA: Diagnosis not present

## 2023-10-01 NOTE — Addendum Note (Signed)
 Addended by: Alvina Chou on: 10/01/2023 07:33 AM   Modules accepted: Orders

## 2023-10-02 LAB — CBC WITH DIFFERENTIAL/PLATELET
Basophils Absolute: 0.1 10*3/uL (ref 0.0–0.2)
Basos: 1 %
EOS (ABSOLUTE): 0.3 10*3/uL (ref 0.0–0.4)
Eos: 4 %
Hematocrit: 45.6 % (ref 34.0–46.6)
Hemoglobin: 15 g/dL (ref 11.1–15.9)
Immature Grans (Abs): 0 10*3/uL (ref 0.0–0.1)
Immature Granulocytes: 1 %
Lymphocytes Absolute: 2 10*3/uL (ref 0.7–3.1)
Lymphs: 31 %
MCH: 30.5 pg (ref 26.6–33.0)
MCHC: 32.9 g/dL (ref 31.5–35.7)
MCV: 93 fL (ref 79–97)
Monocytes Absolute: 0.5 10*3/uL (ref 0.1–0.9)
Monocytes: 7 %
Neutrophils Absolute: 3.7 10*3/uL (ref 1.4–7.0)
Neutrophils: 56 %
Platelets: 331 10*3/uL (ref 150–450)
RBC: 4.91 x10E6/uL (ref 3.77–5.28)
RDW: 12.4 % (ref 11.7–15.4)
WBC: 6.5 10*3/uL (ref 3.4–10.8)

## 2023-10-02 LAB — LIPID PANEL
Chol/HDL Ratio: 4.3 ratio (ref 0.0–4.4)
Cholesterol, Total: 187 mg/dL (ref 100–199)
HDL: 43 mg/dL (ref >39)
LDL Chol Calc (NIH): 128 mg/dL — ABNORMAL HIGH (ref 0–99)
Triglycerides: 88 mg/dL (ref 0–149)
VLDL Cholesterol Cal: 16 mg/dL (ref 5–40)

## 2023-10-02 LAB — COMPREHENSIVE METABOLIC PANEL WITH GFR
ALT: 24 IU/L (ref 0–32)
AST: 21 IU/L (ref 0–40)
Albumin: 3.7 g/dL — ABNORMAL LOW (ref 3.9–4.9)
Alkaline Phosphatase: 81 IU/L (ref 44–121)
BUN/Creatinine Ratio: 15 (ref 9–23)
BUN: 10 mg/dL (ref 6–20)
Bilirubin Total: 0.6 mg/dL (ref 0.0–1.2)
CO2: 21 mmol/L (ref 20–29)
Calcium: 9.1 mg/dL (ref 8.7–10.2)
Chloride: 105 mmol/L (ref 96–106)
Creatinine, Ser: 0.66 mg/dL (ref 0.57–1.00)
Globulin, Total: 2.8 g/dL (ref 1.5–4.5)
Glucose: 109 mg/dL — ABNORMAL HIGH (ref 70–99)
Potassium: 4.2 mmol/L (ref 3.5–5.2)
Sodium: 138 mmol/L (ref 134–144)
Total Protein: 6.5 g/dL (ref 6.0–8.5)
eGFR: 117 mL/min/{1.73_m2} (ref >59)

## 2023-10-02 LAB — HEMOGLOBIN A1C
Est. average glucose Bld gHb Est-mCnc: 108 mg/dL
Hgb A1c MFr Bld: 5.4 % (ref 4.8–5.6)

## 2023-10-02 LAB — TSH: TSH: 2.93 u[IU]/mL (ref 0.450–4.500)

## 2023-10-08 ENCOUNTER — Encounter: Payer: Self-pay | Admitting: Family Medicine

## 2023-10-08 ENCOUNTER — Ambulatory Visit (INDEPENDENT_AMBULATORY_CARE_PROVIDER_SITE_OTHER): Payer: 59 | Admitting: Family Medicine

## 2023-10-08 VITALS — BP 128/76 | HR 84 | Temp 98.1°F | Ht 61.5 in | Wt 292.4 lb

## 2023-10-08 DIAGNOSIS — Z0000 Encounter for general adult medical examination without abnormal findings: Secondary | ICD-10-CM

## 2023-10-08 DIAGNOSIS — F341 Dysthymic disorder: Secondary | ICD-10-CM | POA: Diagnosis not present

## 2023-10-08 DIAGNOSIS — G4733 Obstructive sleep apnea (adult) (pediatric): Secondary | ICD-10-CM

## 2023-10-08 DIAGNOSIS — R739 Hyperglycemia, unspecified: Secondary | ICD-10-CM | POA: Diagnosis not present

## 2023-10-08 MED ORDER — SERTRALINE HCL 100 MG PO TABS: 100.0000 mg | ORAL_TABLET | Freq: Every day | ORAL | 3 refills | Status: DC

## 2023-10-08 NOTE — Assessment & Plan Note (Signed)
 Discussed how this problem influences overall health and the risks it imposes  Reviewed plan for weight loss with lower calorie diet (via better food choices (lower glycemic and portion control) along with exercise building up to or more than 30 minutes 5 days per week including some aerobic activity and strength training   Picky eater Not active   Discussed option of glp-1 if covered in future Has co morbidity of OSA

## 2023-10-08 NOTE — Assessment & Plan Note (Signed)
 Reviewed health habits including diet and exercise and skin cancer prevention Reviewed appropriate screening tests for age  Also reviewed health mt list, fam hx and immunization status , as well as social and family history   See HPI Labs reviewed and ordered Health Maintenance  Topic Date Due   COVID-19 Vaccine (4 - 2024-25 season) 02/23/2023   Flu Shot  01/23/2024   Pap with HPV screening  07/25/2024   DTaP/Tdap/Td vaccine (4 - Td or Tdap) 10/06/2032   Hepatitis C Screening  Completed   HIV Screening  Completed   HPV Vaccine  Aged Out   Meningitis B Vaccine  Aged Out   Hep C screen done in feb with other std screen  Sees gyn for pap  Encouraged self breast exam  Colonsocopy due 01/2027 for polyp Discussed fall prevention, supplements and exercise for bone density  PHQ 11   in treatment

## 2023-10-08 NOTE — Patient Instructions (Addendum)
 Keep walking (or any cardio you like)  Exercise at least 30 minutes (work up to) five days per week   Add some strength training to your routine, this is important for bone and brain health and can reduce your risk of falls and help your body use insulin properly and regulate weight  Light weights, exercise bands , kettle ball and internet videos are a good way to start  Yoga (chair or regular), machines , floor exercises or a gym with machines are also good options   Try to get most of your carbohydrates from produce (with the exception of white potatoes) and whole grains Eat less bread/pasta/rice/snack foods/cereals/sweets and other items from the middle of the grocery store (processed carbs)  Increase your protein  The following are examples of protein in diet  Meat  Fish  Eggs  Dairy products  Soy products  Oat milk  Almond milk Nuts and nut butters  Legumes  Dried beans   Avoid red meat/ fried foods/ egg yolks/ fatty breakfast meats/ butter, cheese and high fat dairy/ and shellfish   Call your insurance and find out if a GLP-1 medicine is covered for obesity  Tell them you have a co morbidity of sleep apnea Let me know

## 2023-10-08 NOTE — Assessment & Plan Note (Signed)
 Unable to tolerate cpap so far Pt is planning follow up and to discuss oral appliance Weight loss is no doubt a large cause   Would consider GLP-1 for weight loss if affordable

## 2023-10-08 NOTE — Progress Notes (Signed)
 Subjective:    Patient ID: Karla Grant, female    DOB: [birth year], 33 y.o.   MRN: [id number]  HPI  Here for health maintenance exam and to review chronic medical problems   Wt Readings from Last 3 Encounters:  10/08/23 292 lb 6.4 oz (132.6 kg)  02/26/23 283 lb (128.4 kg)  10/23/22 283 lb (128.4 kg)   54.36 kg/m  Vitals:   10/08/23 1513  BP: 128/76  Pulse: 84  Temp: 98.1 F (36.7 C)  SpO2: 96%    Immunization History  Administered Date(s) Administered   Influenza Split 06/26/2012   Influenza,inj,Quad PF,6+ Mos 03/15/2016, 08/15/2017, 03/09/2018, 04/24/2022   Influenza-Unspecified 04/25/2014, 03/09/2015, 02/22/2021   Td 04/25/2003   Tdap 12/28/2012, 10/07/2022    Health Maintenance Due  Topic Date Due   COVID-19 Vaccine (4 - 2024-25 season) 02/23/2023   Hep C screening neg in February (along with other STD screening tests)  All negative    Self breast exam- no lumps   Gyn health Has Kyleena IUD  Periods are irregular now -goal is not have menses  Pap 07/2021   Enjoying her niece  No plans to get pregnant in the future  Still has her dogs    Colon cancer screening  No family history of colon cancer  Pt has colonsocopy 01/2020 - 7 year recall for polyp    Bone health   Falls-none  Fractures-none  Supplements    Exercise :  Trying to move more  Walking around her apartment more  Some outdoor walking     Eating out less  Moving to healthier snacks -greek yogurt , some low calorie popcorn   Dislikes vegetables Eating more fruits      Mood    10/08/2023    3:17 PM 02/26/2023   11:03 AM 10/07/2022   11:54 AM 07/08/2022   11:39 AM 04/24/2022    3:16 PM  Depression screen PHQ 2/9  Decreased Interest 1 0 1 1 0  Down, Depressed, Hopeless 2 1 1 1 1   PHQ - 2 Score 3 1 2 2 1   Altered sleeping 3 3 3 3 2   Tired, decreased energy 2 2 3 2 1   Change in appetite 1 0 0 0 0  Feeling bad or failure about yourself  1 0 1 1 1   Trouble concentrating 1  1 1 1 1   Moving slowly or fidgety/restless 0 0 0 0 0  Suicidal thoughts 0 0 0 0 0  PHQ-9 Score 11 7 10 9 6   Difficult doing work/chores Somewhat difficult Somewhat difficult Somewhat difficult Somewhat difficult Not difficult at all      10/08/2023    3:18 PM 02/26/2023   11:03 AM 10/07/2022   11:54 AM 07/08/2022   11:39 AM  GAD 7 : Generalized Anxiety Score  Nervous, Anxious, on Edge 1 0 1 2  Control/stop worrying 1 0 1 1  Worry too much - different things 1 1 1 2   Trouble relaxing 0 1 1 1   Restless 0 1 0 0  Easily annoyed or irritable 0 1 0 0  Afraid - awful might happen 1 0 1 2  Total GAD 7 Score 4 4 5 8   Anxiety Difficulty Somewhat difficult Not difficult at all Somewhat difficult Somewhat difficult     Depression with anxiety  Sertraline 100 mg daily   Had a mini breakdown earlier in year when she was not taking it regularly  Now increased dose to 100 and easier  to remember and doing better   Saw a counselor briefly-did not work out  May look for another one   Had to leave her [employer] job  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     Glucose Lab Results  Component Value Date   HGBA1C 5.4 10/01/2023   HGBA1C 5.2 09/30/2022   HGBA1C 5.3 09/17/2021   Father with DM2   Lab Results  Component Value Date   NA 138 10/01/2023   K 4.2 10/01/2023   CO2 21 10/01/2023   GLUCOSE 109 (H) 10/01/2023   BUN 10 10/01/2023   CREATININE 0.66 10/01/2023   CALCIUM 9.1 10/01/2023   GFR 111.19 09/30/2022   EGFR 117 10/01/2023   GFRNONAA >60 01/27/2020   Lab Results  Component Value Date   ALT 24 10/01/2023   AST 21 10/01/2023   ALKPHOS 81 10/01/2023   BILITOT 0.6 10/01/2023   Lab Results  Component Value Date   WBC 6.5 10/01/2023   HGB 15.0 10/01/2023   HCT 45.6 10/01/2023   MCV 93 10/01/2023   PLT 331 10/01/2023   Lab Results  Component Value Date   TSH 2.930 10/01/2023     Cholesterol  Lab Results  Component  Value Date   CHOL 187 10/01/2023   CHOL 153 09/30/2022   CHOL 176 09/17/2021   Lab Results  Component Value Date   HDL 43 10/01/2023   HDL 44.50 09/30/2022   HDL 49.50 09/17/2021   Lab Results  Component Value Date   LDLCALC 128 (H) 10/01/2023   LDLCALC 96 09/30/2022   LDLCALC 108 (H) 09/17/2021   Lab Results  Component Value Date   TRIG 88 10/01/2023   TRIG 64.0 09/30/2022   TRIG 94.0 09/17/2021   Lab Results  Component Value Date   CHOLHDL 4.3 10/01/2023   CHOLHDL 3 09/30/2022   CHOLHDL 4 09/17/2021   No results found for: "LDLDIRECT"   Is interested in GLP-1 if covered  Has OSA  Has cpap machine-does not tolerate the mask so far       Patient Active Problem List   Diagnosis Date Noted   Venous insufficiency 02/26/2023   Sleep apnea 10/07/2022   Lactose intolerance 03/11/2017   Insomnia 12/28/2012   Migraine 11/08/2011   Routine general medical examination at a health care facility 07/21/2011   Morbid obesity (HCC) 03/09/2010   DEPRESSION/ANXIETY 08/16/2009   Elevated random blood glucose level 02/10/2009   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   Past Medical History:  Diagnosis Date   Anxiety    Depression    Headache(784.0)    Hypoglycemia    Obesity    Past Surgical History:  Procedure Laterality Date   COLONOSCOPY WITH PROPOFOL N/A 02/18/2020   Procedure: COLONOSCOPY WITH PROPOFOL;  Surgeon: Jeani Hawking, MD;  Location: [facility];  Service: Endoscopy;  Laterality: N/A;   POLYPECTOMY  02/18/2020   Procedure: POLYPECTOMY;  Surgeon: Jeani Hawking, MD;  Location: [facility];  Service: Endoscopy;;   Social History   Tobacco Use   Smoking status: Never    Passive exposure: Past   Smokeless tobacco: Never  Vaping Use   Vaping status: Never Used  Substance Use Topics   Alcohol use: Yes    Alcohol/week: 0.0 standard drinks of alcohol    Comment: Occasional   Drug use: No   Family History  Problem Relation Age of Onset   Liver cancer Father     Diabetes Mellitus  II Father    Diabetes Maternal Grandmother    Heart disease Maternal Grandfather    Cancer Paternal Grandfather        cancer   Allergies  Allergen Reactions   Eletriptan Hydrobromide     REACTION: chest pain   Paroxetine Other (See Comments)    Paxil   Topiramate     REACTION: no help   Current Outpatient Medications on File Prior to Visit  Medication Sig Dispense Refill   cetirizine (ZYRTEC) 10 MG tablet Take 10 mg by mouth daily.     levonorgestrel (KYLEENA) 19.5 MG IUD 1 each by Intrauterine route once.      No current facility-administered medications on file prior to visit.    Review of Systems  Constitutional:  Positive for fatigue. Negative for activity change, appetite change, fever and unexpected weight change.  HENT:  Negative for congestion, ear pain, rhinorrhea, sinus pressure and sore throat.   Eyes:  Negative for pain, redness and visual disturbance.  Respiratory:  Negative for cough, shortness of breath and wheezing.   Cardiovascular:  Negative for chest pain and palpitations.  Gastrointestinal:  Negative for abdominal pain, blood in stool, constipation and diarrhea.  Endocrine: Negative for polydipsia and polyuria.  Genitourinary:  Negative for dysuria, frequency and urgency.  Musculoskeletal:  Negative for arthralgias, back pain and myalgias.  Skin:  Negative for pallor and rash.  Allergic/Immunologic: Negative for environmental allergies.  Neurological:  Negative for dizziness, syncope and headaches.  Hematological:  Negative for adenopathy. Does not bruise/bleed easily.  Psychiatric/Behavioral:  Positive for dysphoric mood and sleep disturbance. Negative for decreased concentration, self-injury and suicidal ideas. The patient is nervous/anxious.        Objective:   Physical Exam Constitutional:      General: She is not in acute distress.    Appearance: Normal appearance. She is well-developed. She is obese. She is not ill-appearing or  diaphoretic.  HENT:     Head: Normocephalic and atraumatic.     Right Ear: Tympanic membrane, ear canal and external ear normal.     Left Ear: Tympanic membrane, ear canal and external ear normal.     Nose: Nose normal. No congestion.     Mouth/Throat:     Mouth: Mucous membranes are moist.     Pharynx: Oropharynx is clear. No posterior oropharyngeal erythema.  Eyes:     General: No scleral icterus.    Extraocular Movements: Extraocular movements intact.     Conjunctiva/sclera: Conjunctivae normal.     Pupils: Pupils are equal, round, and reactive to light.  Neck:     Thyroid: No thyromegaly.     Vascular: No carotid bruit or JVD.  Cardiovascular:     Rate and Rhythm: Normal rate and regular rhythm.     Pulses: Normal pulses.     Heart sounds: Normal heart sounds.     No gallop.  Pulmonary:     Effort: Pulmonary effort is normal. No respiratory distress.     Breath sounds: Normal breath sounds. No wheezing.     Comments: Good air exch Chest:     Chest wall: No tenderness.  Abdominal:     General: Bowel sounds are normal. There is no distension or abdominal bruit.     Palpations: Abdomen is soft. There is no mass.     Tenderness: There is no abdominal tenderness.     Hernia: No hernia is present.  Genitourinary:    Comments: Breast and pelvic exam are done by  gyn provider   Musculoskeletal:        General: No tenderness. Normal range of motion.     Cervical back: Normal range of motion and neck supple. No rigidity. No muscular tenderness.     Right lower leg: No edema.     Left lower leg: No edema.     Comments: No kyphosis   Lymphadenopathy:     Cervical: No cervical adenopathy.  Skin:    General: Skin is warm and dry.     Coloration: Skin is not pale.     Findings: No erythema or rash.     Comments: Solar lentigines diffusely   Neurological:     Mental Status: She is alert. Mental status is at baseline.     Cranial Nerves: No cranial nerve deficit.     Motor: No  abnormal muscle tone.     Coordination: Coordination normal.     Gait: Gait normal.     Deep Tendon Reflexes: Reflexes are normal and symmetric. Reflexes normal.  Psychiatric:        Mood and Affect: Mood normal.        Cognition and Memory: Cognition and memory normal.           Assessment & Plan:   Problem List Items Addressed This Visit       Respiratory   Sleep apnea   Unable to tolerate cpap so far Pt is planning follow up and to discuss oral appliance Weight loss is no doubt a large cause   Would consider GLP-1 for weight loss if affordable         Other   Routine general medical examination at a health care facility - Primary   Reviewed health habits including diet and exercise and skin cancer prevention Reviewed appropriate screening tests for age  Also reviewed health mt list, fam hx and immunization status , as well as social and family history   See HPI Labs reviewed and ordered Health Maintenance  Topic Date Due   COVID-19 Vaccine (4 - 2024-25 season) 02/23/2023   Flu Shot  01/23/2024   Pap with HPV screening  07/25/2024   DTaP/Tdap/Td vaccine (4 - Td or Tdap) 10/06/2032   Hepatitis C Screening  Completed   HIV Screening  Completed   HPV Vaccine  Aged Out   Meningitis B Vaccine  Aged Out   Hep C screen done in feb with other std screen  Sees gyn for pap  Encouraged self breast exam  Colonsocopy due 01/2027 for polyp Discussed fall prevention, supplements and exercise for bone density  PHQ 11   in treatment       Morbid obesity (HCC)   Discussed how this problem influences overall health and the risks it imposes  Reviewed plan for weight loss with lower calorie diet (via better food choices (lower glycemic and portion control) along with exercise building up to or more than 30 minutes 5 days per week including some aerobic activity and strength training   Picky eater Not active   Discussed option of glp-1 if covered in future Has co morbidity  of OSA      Elevated random blood glucose level   Lab Results  Component Value Date   HGBA1C 5.4 10/01/2023   HGBA1C 5.2 09/30/2022   HGBA1C 5.3 09/17/2021   disc imp of low glycemic diet and wt loss to prevent DM2       DEPRESSION/ANXIETY   PHQ 11  Actually improved from earlier in  year On zoloft-went up to 100 mg daily and doing better (compliance and mood)   Reviewed stressors/ coping techniques/symptoms/ support sources/ tx options and side effects in detail today  Encouraged better self care including exercise   Call back and Er precautions noted in detail today        Relevant Medications   sertraline (ZOLOFT) 100 MG tablet

## 2023-10-08 NOTE — Assessment & Plan Note (Signed)
 Lab Results  Component Value Date   HGBA1C 5.4 10/01/2023   HGBA1C 5.2 09/30/2022   HGBA1C 5.3 09/17/2021   disc imp of low glycemic diet and wt loss to prevent DM2

## 2023-10-08 NOTE — Assessment & Plan Note (Signed)
 PHQ 11  Actually improved from earlier in year On zoloft-went up to 100 mg daily and doing better (compliance and mood)   Reviewed stressors/ coping techniques/symptoms/ support sources/ tx options and side effects in detail today  Encouraged better self care including exercise   Call back and Er precautions noted in detail today

## 2023-10-09 MED ORDER — WEGOVY 0.25 MG/0.5ML ~~LOC~~ SOAJ: 0.2500 mg | SUBCUTANEOUS | 0 refills | Status: DC

## 2023-10-13 ENCOUNTER — Telehealth: Payer: Self-pay

## 2023-10-13 ENCOUNTER — Other Ambulatory Visit (HOSPITAL_COMMUNITY): Payer: Self-pay

## 2023-10-13 NOTE — Telephone Encounter (Signed)
 Will send to PA dpt to try and see if a PA will lower the cost

## 2023-10-13 NOTE — Telephone Encounter (Signed)
 Per test claim: Medication is not covered by insurance for weight loss, only covered for CV risk reduction as defined by:  Myocardial infarction (MI), (2) Prior ischemic or hemorrhagic stroke, (3) Symptomatic peripheral arterial disease (PAD) as evidence by one of the following (a) Intermittent claudication with ankle-brachial index (ABI) less than 0.85 (at rest); (b) Peripheral arterial revascularization procedure; (c) Amputation due to atherosclerotic disease

## 2023-10-13 NOTE — Telephone Encounter (Signed)
 Pharmacy Patient Advocate Encounter   Received notification from Pt Calls Messages that prior authorization for Wegovy  0.25mg /0.2ml is required/requested.   Insurance verification completed.   The patient is insured through  [facility]  .   Per test claim: Medication is not covered by insurance for weight loss, only covered for CV risk reduction as defined by:  Myocardial infarction (MI), (2) Prior ischemic or hemorrhagic stroke, (3) Symptomatic peripheral arterial disease (PAD) as evidence by one of the following (a) Intermittent claudication with ankle-brachial index (ABI) less than 0.85 (at rest); (b) Peripheral arterial revascularization procedure; (c) Amputation due to atherosclerotic disease

## 2023-10-15 MED ORDER — WEGOVY 0.25 MG/0.5ML ~~LOC~~ SOAJ: 0.2500 mg | SUBCUTANEOUS | 0 refills | Status: AC

## 2023-10-15 NOTE — Addendum Note (Signed)
 Addended by: Deri Fleet A on: 10/15/2023 08:23 PM   Modules accepted: Orders

## 2023-10-16 ENCOUNTER — Other Ambulatory Visit (HOSPITAL_COMMUNITY): Payer: Self-pay

## 2023-10-21 ENCOUNTER — Other Ambulatory Visit: Payer: Self-pay | Admitting: *Deleted

## 2023-10-21 MED ORDER — SERTRALINE HCL 100 MG PO TABS: 100.0000 mg | ORAL_TABLET | Freq: Every day | ORAL | 2 refills | Status: AC

## 2023-11-03 ENCOUNTER — Telehealth: Payer: Self-pay

## 2023-11-03 ENCOUNTER — Other Ambulatory Visit (HOSPITAL_COMMUNITY): Payer: Self-pay

## 2023-11-03 NOTE — Telephone Encounter (Signed)
 Pharmacy Patient Advocate Encounter   Received notification from Patient Pharmacy that prior authorization for Wegovy   is required/requested.   Insurance verification completed.   The patient is insured through [facility] .   Per test claim: patient has a plan benefit exclusion for weight loss. This medication can only be submitted for prior authorization if used for prevention of cardiovascular incident with supporting labs and chart notes. See encounter 10/13/23.

## 2023-11-13 NOTE — Telephone Encounter (Signed)
 This was already addressed on separate phone note on 10/13/23

## 2023-11-15 LAB — GLUCOSE, POCT (MANUAL RESULT ENTRY): POC Glucose: 106 mg/dL — AB (ref 70–99)

## 2023-11-23 ENCOUNTER — Encounter: Payer: Self-pay | Admitting: *Deleted

## 2023-11-23 NOTE — Congregational Nurse Program (Signed)
  Dept: [phone]   Congregational Nurse Program Note  Date of Encounter: 11/23/2023  Past Medical History: Past Medical History:  Diagnosis Date   Anxiety    Depression    Headache(784.0)    Hypoglycemia    Obesity     Encounter Details:  Community Questionnaire - 11/23/23 1153       Questionnaire   Ask client: Do you give verbal consent for me to treat you today? Yes    Student Assistance N/A    Location Patient Served  [employer]    Encounter Setting CN site    Population Status Unknown    Insurance Unknown    Insurance/Financial Assistance Referral N/A    Medication N/A    Medical Provider Yes    Screening Referrals Made N/A    Medical Referrals Made N/A    Medical Appointment Completed N/A    CNP Interventions Educate    Screenings CN Performed Blood Pressure;Blood Glucose    ED Visit Averted N/A    Life-Saving Intervention Made N/A           Attended a community event on diabetes. Shanay Woolman M

## 2024-02-10 ENCOUNTER — Telehealth: Admitting: Family Medicine

## 2024-02-10 DIAGNOSIS — G4733 Obstructive sleep apnea (adult) (pediatric): Secondary | ICD-10-CM

## 2024-02-10 NOTE — Patient Instructions (Signed)
 Please check on the coverage for contrave and let me know if that is something you can afford   If not -I may try topamax  to see if that helps appetite   I put the referral in for the healthy weight and wellness center  Please let us  know if you don't hear in 1-2 weeks to set that up (mychart message or call or letter)   Try not to drink anything with calories besides small amount of creamer in your coffee  Avoid sodas, juices and alcohol if you can   Stay active Walking as tolerated  Get back to your weights!   Add some strength training to your routine, this is important for bone and brain health and can reduce your risk of falls and help your body use insulin properly and regulate weight  Light weights, exercise bands , and internet videos are a good way to start  Yoga (chair or regular), machines , floor exercises or a gym with machines are also good options

## 2024-02-10 NOTE — Assessment & Plan Note (Signed)
 With increasing appetite (pt unsure why and she denies emotional eating) Also very picky eater -dislikes most healthy food and cannot make herself eat most fruit and vegetables  Co morbidity of OSA   Insurance will not pay for glp-1 for obesity   Interested in other options Discussed contrave as option (in past bupropion did not control appetite on its own)  Will check on coverage  If not covered may consider topamax  as option   Plan for exercise Walking as tolerated  Weights-plans to start back with some strength training   Discussed the healthy weight and wellness clinic and referral done Pt does not want to have bariatric surgery

## 2024-02-10 NOTE — Progress Notes (Signed)
 Virtual Visit via Video Note  I connected with Dorothyann A Minardi on 02/10/24 at 12:30 PM EDT by a video enabled telemedicine application and verified that I am speaking with the correct person using two identifiers.  Patient Location: Other:  her workplace Provider Location: Office/Clinic  I discussed the limitations, risks, security, and privacy concerns of performing an evaluation and management service by video and the availability of in person appointments. I also discussed with the patient that there may be a patient responsible charge related to this service. The patient expressed understanding and agreed to proceed.  Parties involved in encounter  Patient: Karla Grant   Provider:  Laine Balls MD   Subjective: PCP: Balls Laine LABOR, MD  Chief Complaint  Patient presents with   Discuss Appetite   HPI Pt presents for:  Increase in appetite  About a month   Increase in eating  Really hungry- not really cravings   5-10 lb may have gained   Stomach feels empty and it growls       if she does not eat, gets a headache  Notes in clothes and with driving   Very picky eater Tries not to keep snacks at home   Popcorn  Pop tarts, slim jims for protein snacks Some high protein optavia snacks-that have lots of protein  Occational apples Occational watermelon  Dislikes veggies  Can eat some cooked brussels and broccoli  Some sweet potatoes  Tries to distract herself -especially at work/harder to do at home   No hormonal changes  Has kyleena IUD -does not affect appetite (this is her 3rd one)  No period with this    Exercise- not a lot of energy   Zoloft  has not changed   Not emotionally eating  No change in stress   Did take one mental health day- relationship issue  Feeling ok from that now   Not ready to consider surgery    In the past -wellbutrin did not help appetite  In the past topamax  did not help headaches / ? If affected appetite   Exercise  Can  walk  Has weights    Lab Results  Component Value Date   NA 138 10/01/2023   K 4.2 10/01/2023   CO2 21 10/01/2023   GLUCOSE 109 (H) 10/01/2023   BUN 10 10/01/2023   CREATININE 0.66 10/01/2023   CALCIUM 9.1 10/01/2023   GFR 111.19 09/30/2022   EGFR 117 10/01/2023   GFRNONAA >60 01/27/2020   Lab Results  Component Value Date   ALT 24 10/01/2023   AST 21 10/01/2023   ALKPHOS 81 10/01/2023   BILITOT 0.6 10/01/2023   Lab Results  Component Value Date   HGBA1C 5.4 10/01/2023      ROS: Per HPI Review of Systems  Constitutional:        Increase in appetite   Easily fatigued   Respiratory:  Negative for shortness of breath and wheezing.   Cardiovascular:  Negative for chest pain, palpitations and claudication.  Musculoskeletal:  Positive for joint pain.  Endo/Heme/Allergies:        Polyphagia      Current Outpatient Medications:    cetirizine (ZYRTEC) 10 MG tablet, Take 10 mg by mouth daily., Disp: , Rfl:    levonorgestrel (KYLEENA) 19.5 MG IUD, 1 each by Intrauterine route once. , Disp: , Rfl:    sertraline  (ZOLOFT ) 100 MG tablet, Take 1 tablet (100 mg total) by mouth daily., Disp: 90 tablet, Rfl: 2  Observations/Objective:  There were no vitals filed for this visit. Physical Exam Patient appears well, in no distress Weight is baseline (obese) No facial swelling or asymmetry Normal voice-not hoarse and no slurred speech No obvious tremor or mobility impairment Moving neck and UEs normally Able to hear the call well  No cough or shortness of breath during interview  Talkative and mentally sharp with no cognitive changes No skin changes on face or neck , no rash or pallor Affect is normal   Assessment and Plan: Morbid obesity (HCC) Assessment & Plan: With increasing appetite (pt unsure why and she denies emotional eating) Also very picky eater -dislikes most healthy food and cannot make herself eat most fruit and vegetables  Co morbidity of OSA   Insurance  will not pay for glp-1 for obesity   Interested in other options Discussed contrave as option (in past bupropion did not control appetite on its own)  Will check on coverage  If not covered may consider topamax  as option   Plan for exercise Walking as tolerated  Weights-plans to start back with some strength training   Discussed the healthy weight and wellness clinic and referral done Pt does not want to have bariatric surgery    Orders: -     Amb Ref to Medical Weight Management  Obstructive sleep apnea syndrome -     Amb Ref to Medical Weight Management    Follow Up Instructions: No follow-ups on file.  Please check on the coverage for contrave and let me know if that is something you can afford   If not -I may try topamax  to see if that helps appetite   I put the referral in for the healthy weight and wellness center  Please let us  know if you don't hear in 1-2 weeks to set that up (mychart message or call or letter)   Try not to drink anything with calories besides small amount of creamer in your coffee  Avoid sodas, juices and alcohol if you can   Stay active Walking as tolerated  Get back to your weights!   Add some strength training to your routine, this is important for bone and brain health and can reduce your risk of falls and help your body use insulin properly and regulate weight  Light weights, exercise bands , and internet videos are a good way to start  Yoga (chair or regular), machines , floor exercises or a gym with machines are also good options      I discussed the assessment and treatment plan with the patient. The patient was provided an opportunity to ask questions, and all were answered. The patient agreed with the plan and demonstrated an understanding of the instructions.   The patient was advised to call back or seek an in-person evaluation if the symptoms worsen or if the condition fails to improve as anticipated.  The above assessment  and management plan was discussed with the patient. The patient verbalized understanding of and has agreed to the management plan.   Laine Balls, MD

## 2024-02-11 ENCOUNTER — Encounter: Payer: Self-pay | Admitting: Family Medicine

## 2024-02-11 MED ORDER — TOPIRAMATE 25 MG PO TABS: 25.0000 mg | ORAL_TABLET | Freq: Two times a day (BID) | ORAL | 1 refills | Status: AC

## 2024-02-17 ENCOUNTER — Encounter: Payer: Self-pay | Admitting: Family Medicine

## 2024-02-17 ENCOUNTER — Ambulatory Visit: Admitting: Family Medicine

## 2024-02-17 VITALS — BP 135/90 | HR 85 | Temp 98.0°F | Ht 61.5 in | Wt 295.0 lb

## 2024-02-17 DIAGNOSIS — F341 Dysthymic disorder: Secondary | ICD-10-CM

## 2024-02-17 DIAGNOSIS — G4733 Obstructive sleep apnea (adult) (pediatric): Secondary | ICD-10-CM | POA: Diagnosis not present

## 2024-02-17 DIAGNOSIS — F418 Other specified anxiety disorders: Secondary | ICD-10-CM | POA: Diagnosis not present

## 2024-02-17 DIAGNOSIS — Z6841 Body Mass Index (BMI) 40.0 and over, adult: Secondary | ICD-10-CM

## 2024-02-17 DIAGNOSIS — E66813 Obesity, class 3: Secondary | ICD-10-CM

## 2024-02-17 NOTE — Progress Notes (Signed)
 Office: [phone]  /  Fax: [phone]   Initial Visit  Karla Grant was seen in clinic today to evaluate for obesity. She is interested in losing weight to improve overall health and reduce the risk of weight related complications. She presents today to review program treatment options, initial physical assessment, and evaluation.     She was referred by: PCP  When asked what else they would like to accomplish? She states: Improve energy levels and physical activity, Reduce number of medications, and Improve quality of life.  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  She is at max weight.  Lives alone with 2 dogs.  Goal would be 200 lb  When asked how has your weight affected you? She states: Having fatigue and Other: mobility is worsening  Some associated conditions: OSA  tested + for mild OSA years ago  Contributing factors: family history of obesity, consumption of processed foods, moderate to high levels of stress, reduced physical activity, sedentary job, and need for convenient foods  Weight promoting medications identified: None  Current nutrition plan: None  Current level of physical activity: None  Current or previous pharmacotherapy: Is interested in pharmacotherapy  Response to medication: Never tried medications   Past medical history includes:   Past Medical History:  Diagnosis Date   Anxiety    Depression    Headache(784.0)    Hypoglycemia    Obesity      Objective:   BP (!) 135/90   Pulse 85   Temp 98 F (36.7 C)   Ht 5' 1.5 (1.562 m)   Wt 295 lb (133.8 kg)   SpO2 98%   BMI 54.84 kg/m  She was weighed on the bioimpedance scale: Body mass index is 54.84 kg/m.  Peak Weight:295 , Body Fat%:56.5, Visceral Fat Rating:21, Weight trend over the last 12 months: Unchanged  General:  Alert, oriented and cooperative. Patient is  in no acute distress.  Respiratory: Normal respiratory effort, no problems with respiration noted   Gait: able to ambulate independently  Mental Status: Normal mood and affect. Normal behavior. Normal judgment and thought content.   DIAGNOSTIC DATA REVIEWED:  BMET    Component Value Date/Time   NA 138 10/01/2023 0733   K 4.2 10/01/2023 0733   CL 105 10/01/2023 0733   CO2 21 10/01/2023 0733   GLUCOSE 109 (H) 10/01/2023 0733   GLUCOSE 99 09/30/2022 0819   BUN 10 10/01/2023 0733   CREATININE 0.66 10/01/2023 0733   CALCIUM 9.1 10/01/2023 0733   GFRNONAA >60 01/27/2020 1243   GFRAA >60 01/27/2020 1243   Lab Results  Component Value Date   HGBA1C 5.4 10/01/2023   HGBA1C 4.9 08/18/2018   No results found for: INSULIN CBC    Component Value Date/Time   WBC 6.5 10/01/2023 0733   WBC 7.3 09/30/2022 0819   RBC 4.91 10/01/2023 0733   RBC 4.73 09/30/2022 0819   HGB 15.0 10/01/2023 0733   HCT 45.6 10/01/2023 0733   PLT 331 10/01/2023 0733   MCV 93 10/01/2023 0733   MCH 30.5 10/01/2023 0733   MCH 29.0 01/27/2020 1243   MCHC 32.9 10/01/2023 0733   MCHC 32.9 09/30/2022 0819   RDW 12.4 10/01/2023 0733   Iron/TIBC/Ferritin/ %Sat No results found for: IRON, TIBC, FERRITIN, IRONPCTSAT Lipid Panel     Component Value Date/Time   CHOL 187 10/01/2023 0733   TRIG  88 10/01/2023 0733   HDL 43 10/01/2023 0733   CHOLHDL 4.3 10/01/2023 0733   CHOLHDL 3 09/30/2022 0819   VLDL 12.8 09/30/2022 0819   LDLCALC 128 (H) 10/01/2023 0733   Hepatic Function Panel     Component Value Date/Time   PROT 6.5 10/01/2023 0733   ALBUMIN 3.7 (L) 10/01/2023 0733   AST 21 10/01/2023 0733   ALT 24 10/01/2023 0733   ALKPHOS 81 10/01/2023 0733   BILITOT 0.6 10/01/2023 0733      Component Value Date/Time   TSH 2.930 10/01/2023 0733     Assessment and Plan:   Obstructive sleep apnea syndrome She reports a hx of mild OSA from an old PSG and was due to repeat it but was never done for  insurance reasons.  She notes daytime somnelence and continued weight gain with fatigue.   Plan to repeat PSG and begin active plan for weight loss  Class 3 severe obesity due to excess calories with body mass index (BMI) of 50.0 to 59.9 in adult  DEPRESSION/ANXIETY Mood has contributed to weight gain over the years She is on sertraline  100 mg daily and reports stable mood Consider the addition of CBT for any emotional eating behaviors     Obesity Treatment / Action Plan:  Patient will work on garnering support from family and friends to begin weight loss journey. Will work on eliminating or reducing the presence of highly palatable, calorie dense foods in the home. Will complete provided nutritional and psychosocial assessment questionnaire before the next appointment. Will be scheduled for indirect calorimetry to determine resting energy expenditure in a fasting state.  This will allow us  to create a reduced calorie, high-protein meal plan to promote loss of fat mass while preserving muscle mass. Will think about ideas on how to incorporate physical activity into their daily routine. Counseled on the health benefits of losing 5%-15% of total body weight. Was counseled on nutritional approaches to weight loss and benefits of reducing processed foods and consuming plant-based foods and high quality protein as part of nutritional weight management. Was counseled on pharmacotherapy and role as an adjunct in weight management.   Obesity Education Performed Today:  She was weighed on the bioimpedance scale and results were discussed and documented in the synopsis.  We discussed obesity as a disease and the importance of a more detailed evaluation of all the factors contributing to the disease.  We discussed the importance of long term lifestyle changes which include nutrition, exercise and behavioral modifications as well as the importance of customizing this to her specific health and  social needs.  We discussed the benefits of reaching a healthier weight to alleviate the symptoms of existing conditions and reduce the risks of the biomechanical, metabolic and psychological effects of obesity.  Karla Grant appears to be in the action stage of change and states they are ready to start intensive lifestyle modifications and behavioral modifications.  22 minutes was spent today on this visit including the above counseling, pre-visit chart review, and post-visit documentation.  Reviewed by clinician on day of visit: allergies, medications, problem list, medical history, surgical history, family history, social history, and previous encounter notes pertinent to obesity diagnosis.    Darice Haddock, D.O. DABFM, [facility] [address] [place], [place] [address] [phone]

## 2024-03-08 ENCOUNTER — Encounter: Payer: Self-pay | Admitting: Family Medicine

## 2024-03-08 ENCOUNTER — Ambulatory Visit: Admitting: Family Medicine

## 2024-03-08 VITALS — BP 130/87 | HR 63 | Temp 97.8°F | Ht 61.5 in | Wt 294.0 lb

## 2024-03-08 DIAGNOSIS — F341 Dysthymic disorder: Secondary | ICD-10-CM

## 2024-03-08 DIAGNOSIS — G4733 Obstructive sleep apnea (adult) (pediatric): Secondary | ICD-10-CM | POA: Diagnosis not present

## 2024-03-08 DIAGNOSIS — R0602 Shortness of breath: Secondary | ICD-10-CM | POA: Diagnosis not present

## 2024-03-08 DIAGNOSIS — R5383 Other fatigue: Secondary | ICD-10-CM

## 2024-03-08 NOTE — Progress Notes (Signed)
 At a Glance:  Vitals Temp: 97.8 F (36.6 C) BP: 130/87 Pulse Rate: 63 SpO2: 98 %   Anthropometric Measurements Height: 5' 1.5 (1.562 m) Weight: 294 lb (133.4 kg) BMI (Calculated): 54.66 Starting Weight: 294lb Peak Weight: 295lb   Body Composition  Body Fat %: 55.4 % Fat Mass (lbs): 163 lbs Muscle Mass (lbs): 124.8 lbs Total Body Water (lbs): 96.8 lbs Visceral Fat Rating : 20   Other Clinical Data RMR: 1987 Fasting: Yes Labs: Yes Today's Visit #: 1 Starting Date: 03/08/24    EKG: Normal sinus rhythm, rate 60.  Indirect Calorimeter completed today shows a VO2 of 289 and a REE of 1987.  Her calculated basal metabolic rate is 8041 thus her basal metabolic rate is better than expected.  Chief Complaint:  Obesity   Subjective:  Karla Grant (MR# [id number]) is a 33 y.o. female who presents for evaluation and treatment of obesity and related comorbidities.   Karla Grant is currently in the action stage of change and ready to dedicate time achieving and maintaining a healthier weight. Karla Grant is interested in becoming our patient and working on intensive lifestyle modifications including (but not limited to) diet and exercise for weight loss.  Karla Grant has been struggling with her weight. She has been unsuccessful in either losing weight, maintaining weight loss, or reaching her healthy weight goal.  She has a positive family history of obesity including her mom.  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  She has no regular exercise.  She is previously considered weight loss surgery but declined.  She reports a history of mild OSA and tried CPAP back in 2017.  She has chronically poor sleep with snoring at night.  Karla Grant's habits were reviewed today and are as follows: her desired weight loss is 100 lb, she has been heavy most of her life, she has significant food cravings issues, and she frequently makes poor food choices.    Other  Fatigue [name] admits to daytime somnolence and admits to waking up still tired. Patient has a history of symptoms of she is not. [name] generally gets 4 hours of sleep per night, and states that she has difficulty falling asleep and nightime awakenings. Snoring is present. Apneic episodes are present. Epworth Sleepiness Score is 7.   Shortness of Breath Karla Grant notes increasing shortness of breath with exercising and seems to be worsening over time with weight gain. She notes getting out of breath sooner with activity than she used to. This has gotten worse recently. Karla Grant denies shortness of breath at rest or orthopnea.   Depression Screen Karla Grant's Food and Mood (modified PHQ-9) score was 13.     10/08/2023    3:17 PM  Depression screen PHQ 2/9  Decreased Interest 1  Down, Depressed, Hopeless 2  PHQ - 2 Score 3  Altered sleeping 3  Tired, decreased energy 2  Change in appetite 1  Feeling bad or failure about yourself  1  Trouble concentrating 1  Moving slowly or fidgety/restless 0  Suicidal thoughts 0  PHQ-9 Score 11  Difficult doing work/chores Somewhat difficult     Assessment and Plan:   Other Fatigue Karla Grant does feel that her weight is causing her energy to be lower than it should be. Fatigue may be related to obesity, depression or many other causes. Labs will be ordered, and in the meanwhile, Karla Grant will focus on self care including making healthy food choices, increasing physical activity and focusing  on stress reduction.  Shortness of Breath [name] does feel that she gets out of breath more easily that she used to when she exercises. Karla Grant's shortness of breath appears to be obesity related and exercise induced. She has agreed to work on weight loss and gradually increase exercise to treat her exercise induced shortness of breath. Will continue to monitor closely.  Ane had a positive depression screening. Depression is commonly associated with obesity and often results in  emotional eating behaviors. Karla Grant will monitor this closely and work on CBT to help improve the non-hunger eating patterns. Referral to Psychology may be required if no improvement is seen as she continues in our clinic.    Problem List Items Addressed This Visit     DEPRESSION/ANXIETY Taking sertraline  100 mg daily for mood She has some emotional eating tendencies with poor sleep at night Karla Grant discussed the importance of treating underlying mood disorder in weight management and also to work in sleep hygiene and stress reduction.  Continue current meds Consider adding in counseling    Sleep apnea She has inadequate sleep at night with associated fatigue that is impacting hunger and energy level for adding in exercise.  Advised a follow up with Almarie Ferrari NP at Pulmonary to reassess need for sleep study given her hx.   Other Visit Diagnoses       SOBOE (shortness of breath on exertion)    -  Primary     Other fatigue       Relevant Orders   EKG 12-Lead (Completed)   VITAMIN D  25 Hydroxy (Vit-D Deficiency, Fractures)   Insulin , random   Hemoglobin A1c   Folate   Comprehensive metabolic panel with GFR   Vitamin B12       Karla Grant is currently in the action stage of change and her goal is to continue with weight loss efforts. I recommend Karla Grant begin the structured treatment plan as follows:  She has agreed to Category 3 Plan  Exercise goals: All adults should avoid inactivity. Some activity is better than none, and adults who participate in any amount of physical activity, gain some health benefits.  Behavioral modification strategies: reduce intake of sugar, work on meal planning, read labels for added sugar, reduce meals out  She was informed of the importance of frequent follow-up visits to maximize her success with intensive lifestyle modifications for her multiple health conditions. She was informed Karla Grant would discuss her lab results at her next visit unless there is a critical  issue that needs to be addressed sooner. Karla Grant agreed to keep her next visit at the agreed upon time to discuss these results.  Objective:  General: Cooperative, alert, well developed, in no acute distress. HEENT: Conjunctivae and lids unremarkable. Cardiovascular: Regular rhythm.  Lungs: Normal work of breathing. Neurologic: No focal deficits.   Lab Results  Component Value Date   CREATININE 0.66 10/01/2023   BUN 10 10/01/2023   NA 138 10/01/2023   K 4.2 10/01/2023   CL 105 10/01/2023   CO2 21 10/01/2023   Lab Results  Component Value Date   ALT 24 10/01/2023   AST 21 10/01/2023   ALKPHOS 81 10/01/2023   BILITOT 0.6 10/01/2023   Lab Results  Component Value Date   HGBA1C 5.4 10/01/2023   HGBA1C 5.2 09/30/2022   HGBA1C 5.3 09/17/2021   HGBA1C 4.9 08/18/2018   No results found for: INSULIN  Lab Results  Component Value Date   TSH 2.930 10/01/2023   Lab Results  Component Value Date   CHOL 187 10/01/2023   HDL 43 10/01/2023   LDLCALC 128 (H) 10/01/2023   TRIG 88 10/01/2023   CHOLHDL 4.3 10/01/2023   Lab Results  Component Value Date   WBC 6.5 10/01/2023   HGB 15.0 10/01/2023   HCT 45.6 10/01/2023   MCV 93 10/01/2023   PLT 331 10/01/2023   No results found for: IRON, TIBC, FERRITIN  Attestation Statements:  Reviewed by clinician on day of visit: allergies, medications, problem list, medical history, surgical history, family history, social history, and previous encounter notes.  Time spent on visit including pre-visit chart review and post-visit charting and face- to face care including nutritional counseling, review of EKG, interpretation of body composition scale and indirect calorimetry and nutrition prescription  was 40 minutes.   Darice Haddock, D.O. DABFM, DABOM [facility] [address] [place], [place] [address] [phone]

## 2024-03-09 ENCOUNTER — Ambulatory Visit: Payer: Self-pay | Admitting: Family Medicine

## 2024-03-09 LAB — COMPREHENSIVE METABOLIC PANEL WITH GFR
ALT: 24 IU/L (ref 0–32)
AST: 19 IU/L (ref 0–40)
Albumin: 3.7 g/dL — ABNORMAL LOW (ref 3.9–4.9)
Alkaline Phosphatase: 78 IU/L (ref 41–116)
BUN/Creatinine Ratio: 10 (ref 9–23)
BUN: 8 mg/dL (ref 6–20)
Bilirubin Total: 0.5 mg/dL (ref 0.0–1.2)
CO2: 21 mmol/L (ref 20–29)
Calcium: 8.8 mg/dL (ref 8.7–10.2)
Chloride: 103 mmol/L (ref 96–106)
Creatinine, Ser: 0.77 mg/dL (ref 0.57–1.00)
Globulin, Total: 2.7 g/dL (ref 1.5–4.5)
Glucose: 91 mg/dL (ref 70–99)
Potassium: 4.3 mmol/L (ref 3.5–5.2)
Sodium: 138 mmol/L (ref 134–144)
Total Protein: 6.4 g/dL (ref 6.0–8.5)
eGFR: 103 mL/min/1.73 (ref >59)

## 2024-03-09 LAB — INSULIN, RANDOM: INSULIN: 17 u[IU]/mL (ref 2.6–24.9)

## 2024-03-09 LAB — VITAMIN D 25 HYDROXY (VIT D DEFICIENCY, FRACTURES): Vit D, 25-Hydroxy: 12.1 ng/mL — ABNORMAL LOW (ref 30.0–100.0)

## 2024-03-09 LAB — FOLATE: Folate: 5.1 ng/mL (ref >3.0)

## 2024-03-09 LAB — HEMOGLOBIN A1C
Est. average glucose Bld gHb Est-mCnc: 111 mg/dL
Hgb A1c MFr Bld: 5.5 % (ref 4.8–5.6)

## 2024-03-09 LAB — VITAMIN B12: Vitamin B-12: 395 pg/mL (ref 232–1245)

## 2024-03-22 ENCOUNTER — Ambulatory Visit: Admitting: Family Medicine

## 2024-04-07 ENCOUNTER — Ambulatory Visit: Admitting: Family Medicine

## 2024-04-07 ENCOUNTER — Encounter: Payer: Self-pay | Admitting: Family Medicine

## 2024-04-07 VITALS — BP 114/81 | HR 69 | Temp 98.2°F | Ht 61.5 in | Wt 291.0 lb

## 2024-04-07 DIAGNOSIS — E88819 Insulin resistance, unspecified: Secondary | ICD-10-CM | POA: Insufficient documentation

## 2024-04-07 DIAGNOSIS — F341 Dysthymic disorder: Secondary | ICD-10-CM

## 2024-04-07 DIAGNOSIS — G4733 Obstructive sleep apnea (adult) (pediatric): Secondary | ICD-10-CM

## 2024-04-07 DIAGNOSIS — E66813 Obesity, class 3: Secondary | ICD-10-CM

## 2024-04-07 DIAGNOSIS — E559 Vitamin D deficiency, unspecified: Secondary | ICD-10-CM

## 2024-04-07 DIAGNOSIS — F32A Depression, unspecified: Secondary | ICD-10-CM | POA: Diagnosis not present

## 2024-04-07 DIAGNOSIS — Z6841 Body Mass Index (BMI) 40.0 and over, adult: Secondary | ICD-10-CM

## 2024-04-07 DIAGNOSIS — F419 Anxiety disorder, unspecified: Secondary | ICD-10-CM

## 2024-04-07 MED ORDER — VITAMIN D (ERGOCALCIFEROL) 1.25 MG (50000 UNIT) PO CAPS: 50000.0000 [IU] | ORAL_CAPSULE | ORAL | 0 refills | Status: AC

## 2024-04-07 NOTE — Progress Notes (Signed)
 Office: [phone]  /  Fax: [phone]  WEIGHT SUMMARY AND BIOMETRICS  Starting Date: 03/08/24  Starting Weight: 294lb   Weight Lost Since Last Visit: 3lb   Vitals Temp: 98.2 F (36.8 C) BP: 114/81 Pulse Rate: 69 SpO2: 97 %   Body Composition  Body Fat %: 55.5 % Fat Mass (lbs): 161.8 lbs Muscle Mass (lbs): 123 lbs Total Body Water (lbs): 97.6 lbs Visceral Fat Rating : 20    HPI  Chief Complaint: OBESITY  Karla Grant is here to discuss her progress with her obesity treatment plan. She is on the the Category 3 Plan and states she is following her eating plan approximately 40 % of the time. She states she is walking more.   Interval History:  Since last office visit she is down 3 lb She is down 1.8 lb of muscle mass and down 1.2 lb of body fat since last visit She is finishing up the food that she still had at home She is cooking more at home and has a good support system She does have some hunger and cravings She is trying out some new protein bars and cereals She has not started any regular exercise  Pharmacotherapy: none  PHYSICAL EXAM:  Blood pressure 114/81, pulse 69, temperature 98.2 F (36.8 C), height 5' 1.5 (1.562 m), weight 291 lb (132 kg), SpO2 97%. Body mass index is 54.09 kg/m.  General: She is overweight, cooperative, alert, well developed, and in no acute distress. PSYCH: Has normal mood, affect and thought process.   Lungs: Normal breathing effort, no conversational dyspnea.   ASSESSMENT AND PLAN  TREATMENT PLAN FOR OBESITY:  Recommended Dietary Goals  Karla Grant is currently in the action stage of change. As such, her goal is to continue weight management plan. She has agreed to the Category 3 Plan.  Behavioral Intervention  We discussed the following Behavioral Modification Strategies today: increasing lean protein intake to established goals, decreasing simple carbohydrates , increasing vegetables, increasing fiber rich foods, avoiding  skipping meals, increasing water intake , work on meal planning and preparation, reading food labels , keeping healthy foods at home, identifying sources and decreasing liquid calories, practice mindfulness eating and understand the difference between hunger signals and cravings, continue to practice mindfulness when eating, planning for success, and better snacking choices.  Additional resources provided today: NA  Recommended Physical Activity Goals  Karla Grant has been advised to work up to 150 minutes of moderate intensity aerobic activity a week and strengthening exercises 2-3 times per week for cardiovascular health, weight loss maintenance and preservation of muscle mass.   She has agreed to Think about enjoyable ways to increase daily physical activity and overcoming barriers to exercise and Increase physical activity in their day and reduce sedentary time (increase NEAT).  Pharmacotherapy changes for the treatment of obesity: none  ASSOCIATED CONDITIONS ADDRESSED TODAY  Insulin  resistance New.  Reviewed lab results with patient from last visit.  Fasting insulin  high at 17 She has never used metformin.  She has signs of IR including polyphagia and weight gain. Continue to work on reducing added sugar and refined carbs We discussed the importance of regular physical activity and weight reduction  Vitamin D  deficiency New. Last vitamin D  Lab Results  Component Value Date   VD25OH 12.1 (L) 03/08/2024  Reviewed lab finding with patient.  Her vitamin D  is profoundly low and she is currently not on a vitamin D  supplement.  Begin vitamin D  50,000 IU twice weekly and recheck lab  in 3 months.  We discussed how low vitamin D  can contribute to fatigue, bone loss and poor immune function.  -     Vitamin D  (Ergocalciferol ); Take 1 capsule (50,000 Units total) by mouth 2 (two) times a week.  Dispense: 10 capsule; Refill: 0  Class 3 severe obesity due to excess calories with serious comorbidity and  body mass index (BMI) of 50.0 to 59.9 in adult [facility])  Obstructive sleep apnea syndrome She plans to get back in with Almarie Ferrari, NP for follow-up of her mild OSA Continue active plan for weight reduction  DEPRESSION/ANXIETY She reports a stable mood on sertraline  100 mg once daily by her PCP.  Emotional eating is under better control with her current eating plan.  Continue to work on stress reduction, self-care and good sleep at night.     She was informed of the importance of frequent follow up visits to maximize her success with intensive lifestyle modifications for her multiple health conditions.   ATTESTASTION STATEMENTS:  Reviewed by clinician on day of visit: allergies, medications, problem list, medical history, surgical history, family history, social history, and previous encounter notes pertinent to obesity diagnosis.   I have personally spent 33 minutes total time today in preparation, patient care, nutritional counseling and education,  and documentation for this visit, including the following: review of most recent clinical lab tests, prescribing medications/ refilling medications, reviewing medical assistant documentation, review and interpretation of bioimpedence results.     Darice Haddock, D.O. DABFM, DABOM [facility] [address] [place], [place] [address] [phone]

## 2024-04-07 NOTE — Patient Instructions (Addendum)
 Great job with food changes!  Check out meat sticks: [place] pepperoni sticks, Chomps meal sticks [employer] frozen meals  Avoid foods and drinks with > 8 g of added sugar per serving  Begin RX vitamin D  2 x a week Begin a women's Multivitamin daily (gummies)  Check out the gym at work- aim for 15 min on the treadmill 3 days/ wk

## 2024-05-11 ENCOUNTER — Ambulatory Visit: Admitting: Family Medicine

## 2024-05-16 ENCOUNTER — Other Ambulatory Visit: Payer: Self-pay | Admitting: Family Medicine

## 2024-05-16 DIAGNOSIS — E559 Vitamin D deficiency, unspecified: Secondary | ICD-10-CM

## 2024-06-02 ENCOUNTER — Ambulatory Visit: Admitting: Family Medicine

## 2024-06-15 ENCOUNTER — Encounter: Payer: Self-pay | Admitting: Family Medicine

## 2024-10-04 ENCOUNTER — Other Ambulatory Visit

## 2024-10-11 ENCOUNTER — Encounter: Admitting: Family Medicine
